# Patient Record
Sex: Female | Born: 1970 | Hispanic: No | Marital: Married
Health system: Southern US, Community
[De-identification: ages and names within clinical notes are randomized; demographics above are authoritative.]

---

## 2005-01-31 ENCOUNTER — Other Ambulatory Visit: Payer: Self-pay

## 2005-01-31 ENCOUNTER — Emergency Department: Payer: Self-pay | Admitting: Internal Medicine

## 2005-07-05 ENCOUNTER — Ambulatory Visit: Payer: Self-pay

## 2006-02-26 ENCOUNTER — Emergency Department: Payer: Self-pay | Admitting: Emergency Medicine

## 2006-10-13 ENCOUNTER — Emergency Department: Payer: Self-pay | Admitting: Emergency Medicine

## 2007-03-07 ENCOUNTER — Emergency Department: Payer: Self-pay | Admitting: Emergency Medicine

## 2007-04-10 ENCOUNTER — Inpatient Hospital Stay: Payer: Self-pay | Admitting: Otolaryngology

## 2007-04-28 ENCOUNTER — Inpatient Hospital Stay: Payer: Self-pay | Admitting: Internal Medicine

## 2008-01-01 ENCOUNTER — Emergency Department: Payer: Self-pay | Admitting: Emergency Medicine

## 2008-01-29 ENCOUNTER — Emergency Department: Payer: Self-pay | Admitting: Emergency Medicine

## 2008-10-15 ENCOUNTER — Ambulatory Visit: Payer: Self-pay | Admitting: Internal Medicine

## 2008-11-12 ENCOUNTER — Emergency Department: Payer: Self-pay

## 2010-03-24 ENCOUNTER — Ambulatory Visit: Payer: Self-pay | Admitting: Internal Medicine

## 2010-04-05 ENCOUNTER — Inpatient Hospital Stay: Payer: Self-pay | Admitting: Psychiatry

## 2010-04-10 ENCOUNTER — Ambulatory Visit: Payer: Self-pay | Admitting: Cardiology

## 2010-04-10 ENCOUNTER — Observation Stay: Payer: Self-pay | Admitting: Internal Medicine

## 2010-05-02 ENCOUNTER — Inpatient Hospital Stay: Payer: Self-pay | Admitting: Internal Medicine

## 2010-05-24 ENCOUNTER — Ambulatory Visit: Payer: Self-pay | Admitting: Internal Medicine

## 2010-05-27 ENCOUNTER — Inpatient Hospital Stay: Payer: Self-pay | Admitting: Internal Medicine

## 2010-06-23 ENCOUNTER — Ambulatory Visit: Payer: Self-pay | Admitting: Internal Medicine

## 2011-03-15 ENCOUNTER — Inpatient Hospital Stay: Payer: Self-pay | Admitting: Internal Medicine

## 2011-06-24 ENCOUNTER — Ambulatory Visit: Payer: Self-pay | Admitting: Internal Medicine

## 2011-07-11 ENCOUNTER — Inpatient Hospital Stay: Payer: Self-pay | Admitting: Internal Medicine

## 2011-07-25 ENCOUNTER — Ambulatory Visit: Payer: Self-pay | Admitting: Internal Medicine

## 2011-08-23 ENCOUNTER — Inpatient Hospital Stay: Payer: Self-pay | Admitting: *Deleted

## 2012-01-26 ENCOUNTER — Inpatient Hospital Stay: Payer: Self-pay | Admitting: Psychiatry

## 2012-01-26 LAB — COMPREHENSIVE METABOLIC PANEL
Albumin: 3.1 g/dL — ABNORMAL LOW (ref 3.4–5.0)
Alkaline Phosphatase: 66 U/L (ref 50–136)
BUN: 8 mg/dL (ref 7–18)
Bilirubin,Total: 2.1 mg/dL — ABNORMAL HIGH (ref 0.2–1.0)
Chloride: 101 mmol/L (ref 98–107)
Co2: 22 mmol/L (ref 21–32)
Creatinine: 0.86 mg/dL (ref 0.60–1.30)
EGFR (African American): >60
EGFR (Non-African Amer.): >60
Osmolality: 276 (ref 275–301)
Potassium: 3.2 mmol/L — ABNORMAL LOW (ref 3.5–5.1)
Sodium: 140 mmol/L (ref 136–145)
Total Protein: 8.3 g/dL — ABNORMAL HIGH (ref 6.4–8.2)

## 2012-01-26 LAB — CBC
HGB: 9.3 g/dL — ABNORMAL LOW (ref 12.0–16.0)
MCH: 31.8 pg (ref 26.0–34.0)
MCHC: 33.4 g/dL (ref 32.0–36.0)
Platelet: 92 10*3/uL — ABNORMAL LOW (ref 150–440)
RDW: 17 % — ABNORMAL HIGH (ref 11.5–14.5)

## 2012-01-26 LAB — DRUG SCREEN, URINE
Barbiturates, Ur Screen: NEGATIVE (ref ?–200)
Cannabinoid 50 Ng, Ur ~~LOC~~: NEGATIVE (ref ?–50)
Cocaine Metabolite,Ur ~~LOC~~: POSITIVE (ref ?–300)
MDMA (Ecstasy)Ur Screen: NEGATIVE (ref ?–500)
Phencyclidine (PCP) Ur S: NEGATIVE (ref ?–25)

## 2012-01-26 LAB — SALICYLATE LEVEL: Salicylates, Serum: <1.7 mg/dL

## 2012-01-26 LAB — TSH: Thyroid Stimulating Horm: 0.846 u[IU]/mL

## 2012-01-26 LAB — ACETAMINOPHEN LEVEL: Acetaminophen: <2 ug/mL

## 2012-01-29 LAB — BEHAVIORAL MEDICINE 1 PANEL
Alkaline Phosphatase: 72 U/L (ref 50–136)
BUN: 4 mg/dL — ABNORMAL LOW (ref 7–18)
Basophil #: 0 10*3/uL (ref 0.0–0.1)
Basophil %: 0.8 %
Bilirubin,Total: 1.1 mg/dL — ABNORMAL HIGH (ref 0.2–1.0)
Calcium, Total: 8.2 mg/dL — ABNORMAL LOW (ref 8.5–10.1)
Co2: 27 mmol/L (ref 21–32)
Creatinine: 0.9 mg/dL (ref 0.60–1.30)
EGFR (African American): >60
EGFR (Non-African Amer.): >60
Eosinophil #: 0.4 10*3/uL (ref 0.0–0.7)
Eosinophil %: 10.4 %
Glucose: 91 mg/dL (ref 65–99)
HCT: 26.9 % — ABNORMAL LOW (ref 35.0–47.0)
Lymphocyte #: 0.5 10*3/uL — ABNORMAL LOW (ref 1.0–3.6)
MCH: 32 pg (ref 26.0–34.0)
MCV: 97 fL (ref 80–100)
Monocyte #: 0.3 10*3/uL (ref 0.0–0.7)
Monocyte %: 9.3 %
Neutrophil #: 2.3 10*3/uL (ref 1.4–6.5)
Neutrophil %: 64.4 %
Osmolality: 276 (ref 275–301)
Potassium: 3.7 mmol/L (ref 3.5–5.1)
RBC: 2.76 10*6/uL — ABNORMAL LOW (ref 3.80–5.20)
SGOT(AST): 55 U/L — ABNORMAL HIGH (ref 15–37)
SGPT (ALT): 32 U/L
Sodium: 140 mmol/L (ref 136–145)
Thyroid Stimulating Horm: 1.43 u[IU]/mL
WBC: 3.6 10*3/uL (ref 3.6–11.0)

## 2012-01-29 LAB — PROTIME-INR: Prothrombin Time: 17.6 secs — ABNORMAL HIGH (ref 11.5–14.7)

## 2012-01-30 LAB — IRON: Iron: 208 ug/dL — ABNORMAL HIGH (ref 50–170)

## 2012-03-15 ENCOUNTER — Inpatient Hospital Stay: Payer: Self-pay | Admitting: Internal Medicine

## 2012-03-15 LAB — COMPREHENSIVE METABOLIC PANEL
Albumin: 2.5 g/dL — ABNORMAL LOW (ref 3.4–5.0)
Alkaline Phosphatase: 55 U/L (ref 50–136)
Calcium, Total: 7.8 mg/dL — ABNORMAL LOW (ref 8.5–10.1)
Glucose: 114 mg/dL — ABNORMAL HIGH (ref 65–99)
SGOT(AST): 96 U/L — ABNORMAL HIGH (ref 15–37)
SGPT (ALT): 37 U/L
Sodium: 132 mmol/L — ABNORMAL LOW (ref 136–145)

## 2012-03-15 LAB — URINALYSIS, COMPLETE
Blood: NEGATIVE
Glucose,UR: NEGATIVE mg/dL (ref 0–75)
Ketone: NEGATIVE
Ph: 5 (ref 4.5–8.0)
Protein: NEGATIVE
RBC,UR: NONE SEEN /HPF (ref 0–5)
Specific Gravity: 1.015 (ref 1.003–1.030)
Squamous Epithelial: <1
WBC UR: NONE SEEN /HPF (ref 0–5)

## 2012-03-15 LAB — ETHANOL
Ethanol %: 0.051 % (ref 0.000–0.080)
Ethanol: 51 mg/dL

## 2012-03-15 LAB — CBC
HCT: 16.5 % — ABNORMAL LOW (ref 35.0–47.0)
HGB: 5.4 g/dL — ABNORMAL LOW (ref 12.0–16.0)
MCV: 94 fL (ref 80–100)
RBC: 1.75 10*6/uL — ABNORMAL LOW (ref 3.80–5.20)
WBC: 7 10*3/uL (ref 3.6–11.0)

## 2012-03-15 LAB — TROPONIN I: Troponin-I: <0.02 ng/mL

## 2012-03-15 LAB — LIPASE, BLOOD: Lipase: 686 U/L — ABNORMAL HIGH (ref 73–393)

## 2012-03-16 LAB — CBC WITH DIFFERENTIAL/PLATELET
Basophil #: 0 10*3/uL (ref 0.0–0.1)
Basophil #: 0 10*3/uL (ref 0.0–0.1)
Basophil #: 0 10*3/uL (ref 0.0–0.1)
Basophil %: 0.3 %
Basophil %: 0.6 %
Basophil %: 0.4 %
Eosinophil #: 0.2 10*3/uL (ref 0.0–0.7)
Eosinophil #: 0.2 10*3/uL (ref 0.0–0.7)
Eosinophil #: 0.2 10*3/uL (ref 0.0–0.7)
Eosinophil %: 4.2 %
Eosinophil %: 4.5 %
HCT: 21.9 % — ABNORMAL LOW (ref 35.0–47.0)
HCT: 24.9 % — ABNORMAL LOW (ref 35.0–47.0)
HCT: 24.6 % — ABNORMAL LOW (ref 35.0–47.0)
HGB: 7.2 g/dL — ABNORMAL LOW (ref 12.0–16.0)
HGB: 8.2 g/dL — ABNORMAL LOW (ref 12.0–16.0)
Lymphocyte #: 0.3 10*3/uL — ABNORMAL LOW (ref 1.0–3.6)
Lymphocyte #: 0.3 10*3/uL — ABNORMAL LOW (ref 1.0–3.6)
Lymphocyte %: 10.9 %
Lymphocyte %: 7.6 %
MCH: 30.4 pg (ref 26.0–34.0)
MCH: 29.6 pg (ref 26.0–34.0)
MCHC: 32.8 g/dL (ref 32.0–36.0)
MCV: 92 fL (ref 80–100)
Monocyte #: 0.6 10*3/uL (ref 0.0–0.7)
Monocyte #: 0.5 10*3/uL (ref 0.0–0.7)
Monocyte %: 14.1 %
Monocyte %: 12.9 %
Monocyte %: 10.6 %
Neutrophil #: 3.2 10*3/uL (ref 1.4–6.5)
Neutrophil #: 2.9 10*3/uL (ref 1.4–6.5)
Neutrophil #: 2.9 10*3/uL (ref 1.4–6.5)
Neutrophil %: 70.5 %
Neutrophil %: 76.9 %
Platelet: 92 10*3/uL — ABNORMAL LOW (ref 150–440)
Platelet: 82 10*3/uL — ABNORMAL LOW (ref 150–440)
RBC: 2.41 10*6/uL — ABNORMAL LOW (ref 3.80–5.20)
RBC: 2.72 10*6/uL — ABNORMAL LOW (ref 3.80–5.20)
RDW: 17 % — ABNORMAL HIGH (ref 11.5–14.5)
RDW: 17.2 % — ABNORMAL HIGH (ref 11.5–14.5)
WBC: 4.5 10*3/uL (ref 3.6–11.0)
WBC: 3.7 10*3/uL (ref 3.6–11.0)

## 2012-03-16 LAB — COMPREHENSIVE METABOLIC PANEL
Albumin: 2.4 g/dL — ABNORMAL LOW (ref 3.4–5.0)
Alkaline Phosphatase: 51 U/L (ref 50–136)
BUN: 47 mg/dL — ABNORMAL HIGH (ref 7–18)
Bilirubin,Total: 2.8 mg/dL — ABNORMAL HIGH (ref 0.2–1.0)
Calcium, Total: 7.2 mg/dL — ABNORMAL LOW (ref 8.5–10.1)
Chloride: 92 mmol/L — ABNORMAL LOW (ref 98–107)
Creatinine: 1.41 mg/dL — ABNORMAL HIGH (ref 0.60–1.30)
EGFR (African American): 53 — ABNORMAL LOW
Glucose: 98 mg/dL (ref 65–99)
SGOT(AST): 88 U/L — ABNORMAL HIGH (ref 15–37)
SGPT (ALT): 37 U/L
Sodium: 133 mmol/L — ABNORMAL LOW (ref 136–145)
Total Protein: 6.1 g/dL — ABNORMAL LOW (ref 6.4–8.2)

## 2012-03-16 LAB — PHOSPHORUS: Phosphorus: 4.8 mg/dL

## 2012-03-16 LAB — MAGNESIUM: Magnesium: 1.8 mg/dL

## 2012-03-16 LAB — LIPASE, BLOOD: Lipase: 1034 U/L — ABNORMAL HIGH

## 2012-03-17 LAB — COMPREHENSIVE METABOLIC PANEL
Albumin: 2.3 g/dL — ABNORMAL LOW (ref 3.4–5.0)
Anion Gap: 9 (ref 7–16)
BUN: 22 mg/dL — ABNORMAL HIGH (ref 7–18)
Bilirubin,Total: 2.9 mg/dL — ABNORMAL HIGH (ref 0.2–1.0)
Chloride: 95 mmol/L — ABNORMAL LOW (ref 98–107)
Creatinine: 0.93 mg/dL (ref 0.60–1.30)
Osmolality: 266 (ref 275–301)
Potassium: 3.6 mmol/L (ref 3.5–5.1)
Sodium: 130 mmol/L — ABNORMAL LOW (ref 136–145)
Total Protein: 6 g/dL — ABNORMAL LOW (ref 6.4–8.2)

## 2012-03-18 LAB — COMPREHENSIVE METABOLIC PANEL
Anion Gap: 11 (ref 7–16)
Bilirubin,Total: 2.5 mg/dL — ABNORMAL HIGH (ref 0.2–1.0)
Chloride: 100 mmol/L (ref 98–107)
Creatinine: 0.84 mg/dL (ref 0.60–1.30)
EGFR (African American): >60
EGFR (Non-African Amer.): >60
Potassium: 3.6 mmol/L (ref 3.5–5.1)
Sodium: 135 mmol/L — ABNORMAL LOW (ref 136–145)
Total Protein: 5.6 g/dL — ABNORMAL LOW (ref 6.4–8.2)

## 2012-03-18 LAB — CBC WITH DIFFERENTIAL/PLATELET
Basophil #: 0 10*3/uL (ref 0.0–0.1)
Basophil %: 0.8 %
Eosinophil %: 2.4 %
HCT: 24 % — ABNORMAL LOW (ref 35.0–47.0)
Lymphocyte #: 0.5 10*3/uL — ABNORMAL LOW (ref 1.0–3.6)
Lymphocyte %: 12.6 %
MCH: 30.3 pg (ref 26.0–34.0)
MCHC: 32.6 g/dL (ref 32.0–36.0)
Platelet: 86 10*3/uL — ABNORMAL LOW (ref 150–440)

## 2012-03-18 LAB — AMMONIA: Ammonia, Plasma: 83 mcmol/L — ABNORMAL HIGH (ref 11–32)

## 2012-03-18 LAB — PROTIME-INR: Prothrombin Time: 17.2 secs — ABNORMAL HIGH (ref 11.5–14.7)

## 2012-03-19 LAB — CBC WITH DIFFERENTIAL/PLATELET
Basophil #: 0 10*3/uL (ref 0.0–0.1)
Basophil %: 0.7 %
Eosinophil #: 0.1 10*3/uL (ref 0.0–0.7)
HCT: 23.4 % — ABNORMAL LOW (ref 35.0–47.0)
HGB: 7.7 g/dL — ABNORMAL LOW (ref 12.0–16.0)
Lymphocyte %: 14.7 %
MCH: 30.3 pg (ref 26.0–34.0)
MCHC: 33 g/dL (ref 32.0–36.0)
Monocyte %: 18.2 %
Neutrophil %: 62.8 %
RBC: 2.55 10*6/uL — ABNORMAL LOW (ref 3.80–5.20)
RDW: 17 % — ABNORMAL HIGH (ref 11.5–14.5)
WBC: 3.1 10*3/uL — ABNORMAL LOW (ref 3.6–11.0)

## 2012-03-19 LAB — COMPREHENSIVE METABOLIC PANEL
Albumin: 2.1 g/dL — ABNORMAL LOW (ref 3.4–5.0)
Alkaline Phosphatase: 63 U/L (ref 50–136)
Anion Gap: 11 (ref 7–16)
BUN: 9 mg/dL (ref 7–18)
Calcium, Total: 6.9 mg/dL — CL (ref 8.5–10.1)
Chloride: 99 mmol/L (ref 98–107)
Co2: 23 mmol/L (ref 21–32)
Creatinine: 1.01 mg/dL (ref 0.60–1.30)
EGFR (African American): >60
EGFR (Non-African Amer.): >60
Osmolality: 265 (ref 275–301)
Total Protein: 5.6 g/dL — ABNORMAL LOW (ref 6.4–8.2)

## 2012-03-19 LAB — AMMONIA: Ammonia, Plasma: 48 mcmol/L — ABNORMAL HIGH (ref 11–32)

## 2012-03-20 LAB — PROTIME-INR: Prothrombin Time: 17.2 secs — ABNORMAL HIGH (ref 11.5–14.7)

## 2012-03-20 LAB — COMPREHENSIVE METABOLIC PANEL
Albumin: 2 g/dL — ABNORMAL LOW (ref 3.4–5.0)
Alkaline Phosphatase: 67 U/L (ref 50–136)
Anion Gap: 12 (ref 7–16)
BUN: 7 mg/dL (ref 7–18)
Bilirubin,Total: 1.9 mg/dL — ABNORMAL HIGH (ref 0.2–1.0)
Co2: 22 mmol/L (ref 21–32)
Creatinine: 0.93 mg/dL (ref 0.60–1.30)
EGFR (African American): >60
EGFR (Non-African Amer.): >60
Glucose: 82 mg/dL (ref 65–99)
Osmolality: 269 (ref 275–301)
Potassium: 3.7 mmol/L (ref 3.5–5.1)
SGPT (ALT): 55 U/L
Sodium: 136 mmol/L (ref 136–145)
Total Protein: 5.4 g/dL — ABNORMAL LOW (ref 6.4–8.2)

## 2012-03-20 LAB — CBC WITH DIFFERENTIAL/PLATELET
Basophil #: 0 10*3/uL (ref 0.0–0.1)
Basophil %: 0.8 %
Eosinophil #: 0.1 10*3/uL (ref 0.0–0.7)
Eosinophil %: 2.7 %
HCT: 23.9 % — ABNORMAL LOW (ref 35.0–47.0)
HGB: 7.9 g/dL — ABNORMAL LOW (ref 12.0–16.0)
Lymphocyte %: 15 %
MCV: 93 fL (ref 80–100)
Monocyte #: 0.7 10*3/uL (ref 0.0–0.7)
Monocyte %: 18.9 %
Neutrophil %: 62.6 %
RDW: 17.6 % — ABNORMAL HIGH (ref 11.5–14.5)
WBC: 3.6 10*3/uL (ref 3.6–11.0)

## 2012-03-21 LAB — CBC WITH DIFFERENTIAL/PLATELET
Basophil #: 0 10*3/uL (ref 0.0–0.1)
Basophil %: 0.8 %
Eosinophil %: 1.4 %
HGB: 7.4 g/dL — ABNORMAL LOW (ref 12.0–16.0)
Lymphocyte #: 0.5 10*3/uL — ABNORMAL LOW (ref 1.0–3.6)
Lymphocyte %: 17.9 %
MCH: 29.3 pg (ref 26.0–34.0)
MCHC: 31.9 g/dL — ABNORMAL LOW (ref 32.0–36.0)
MCV: 92 fL (ref 80–100)
Monocyte #: 0.6 10*3/uL (ref 0.0–0.7)
Monocyte %: 19.8 %
Platelet: 98 10*3/uL — ABNORMAL LOW (ref 150–440)
RBC: 2.53 10*6/uL — ABNORMAL LOW (ref 3.80–5.20)
RDW: 17.1 % — ABNORMAL HIGH (ref 11.5–14.5)

## 2012-03-21 LAB — HEPATIC FUNCTION PANEL A (ARMC)
Albumin: 2 g/dL — ABNORMAL LOW (ref 3.4–5.0)
Alkaline Phosphatase: 63 U/L (ref 50–136)
Bilirubin, Direct: 0.7 mg/dL — ABNORMAL HIGH (ref 0.00–0.20)

## 2012-06-25 LAB — CBC
HGB: 8.9 g/dL — ABNORMAL LOW (ref 12.0–16.0)
MCH: 32.3 pg (ref 26.0–34.0)
MCHC: 33.2 g/dL (ref 32.0–36.0)
MCV: 97 fL (ref 80–100)
Platelet: 78 10*3/uL — ABNORMAL LOW (ref 150–440)
RDW: 16.4 % — ABNORMAL HIGH (ref 11.5–14.5)

## 2012-06-25 LAB — DRUG SCREEN, URINE
Barbiturates, Ur Screen: NEGATIVE (ref ?–200)
Cannabinoid 50 Ng, Ur ~~LOC~~: NEGATIVE (ref ?–50)
Cocaine Metabolite,Ur ~~LOC~~: POSITIVE (ref ?–300)
MDMA (Ecstasy)Ur Screen: NEGATIVE (ref ?–500)
Opiate, Ur Screen: NEGATIVE (ref ?–300)
Phencyclidine (PCP) Ur S: NEGATIVE (ref ?–25)
Tricyclic, Ur Screen: NEGATIVE (ref ?–1000)

## 2012-06-25 LAB — COMPREHENSIVE METABOLIC PANEL
Anion Gap: 10 (ref 7–16)
BUN: 4 mg/dL — ABNORMAL LOW (ref 7–18)
Calcium, Total: 7.8 mg/dL — ABNORMAL LOW (ref 8.5–10.1)
Chloride: 109 mmol/L — ABNORMAL HIGH (ref 98–107)
EGFR (African American): >60
Glucose: 89 mg/dL (ref 65–99)
Potassium: 3.5 mmol/L (ref 3.5–5.1)
SGOT(AST): 79 U/L — ABNORMAL HIGH (ref 15–37)
Sodium: 142 mmol/L (ref 136–145)
Total Protein: 7.5 g/dL (ref 6.4–8.2)

## 2012-06-25 LAB — URINALYSIS, COMPLETE
Bilirubin,UR: NEGATIVE
Blood: NEGATIVE
Glucose,UR: NEGATIVE mg/dL (ref 0–75)
Leukocyte Esterase: NEGATIVE
RBC,UR: 2 /HPF (ref 0–5)
Squamous Epithelial: 14
WBC UR: 1 /HPF (ref 0–5)

## 2012-06-25 LAB — ETHANOL: Ethanol: 239 mg/dL

## 2012-06-26 ENCOUNTER — Inpatient Hospital Stay: Payer: Self-pay | Admitting: Psychiatry

## 2012-06-26 LAB — MAGNESIUM: Magnesium: 1.7 mg/dL — ABNORMAL LOW

## 2012-06-27 LAB — HEPATIC FUNCTION PANEL A (ARMC)
Albumin: 2.4 g/dL — ABNORMAL LOW (ref 3.4–5.0)
Bilirubin,Total: 2.5 mg/dL — ABNORMAL HIGH (ref 0.2–1.0)
SGOT(AST): 60 U/L — ABNORMAL HIGH (ref 15–37)
SGPT (ALT): 26 U/L
Total Protein: 6.5 g/dL (ref 6.4–8.2)

## 2012-06-27 LAB — CBC WITH DIFFERENTIAL/PLATELET
Basophil #: 0 10*3/uL (ref 0.0–0.1)
HGB: 8.1 g/dL — ABNORMAL LOW (ref 12.0–16.0)
Lymphocyte #: 0.3 10*3/uL — ABNORMAL LOW (ref 1.0–3.6)
Lymphocyte %: 15.7 %
MCH: 32.3 pg (ref 26.0–34.0)
MCV: 97 fL (ref 80–100)
Monocyte #: 0.2 x10 3/mm (ref 0.2–0.9)
Platelet: 58 10*3/uL — ABNORMAL LOW (ref 150–440)
RBC: 2.49 10*6/uL — ABNORMAL LOW (ref 3.80–5.20)
RDW: 15.6 % — ABNORMAL HIGH (ref 11.5–14.5)
WBC: 1.8 10*3/uL — CL (ref 3.6–11.0)

## 2012-06-27 LAB — PROTIME-INR
INR: 1.2
Prothrombin Time: 15.7 secs — ABNORMAL HIGH (ref 11.5–14.7)

## 2012-06-28 LAB — CBC WITH DIFFERENTIAL/PLATELET
Basophil %: 0.5 %
Eosinophil #: 0.2 10*3/uL (ref 0.0–0.7)
HCT: 26.4 % — ABNORMAL LOW (ref 35.0–47.0)
HGB: 8.8 g/dL — ABNORMAL LOW (ref 12.0–16.0)
MCV: 97 fL (ref 80–100)
Monocyte #: 0.3 x10 3/mm (ref 0.2–0.9)
Neutrophil %: 64.2 %
Platelet: 68 10*3/uL — ABNORMAL LOW (ref 150–440)
RBC: 2.72 10*6/uL — ABNORMAL LOW (ref 3.80–5.20)
RDW: 16.3 % — ABNORMAL HIGH (ref 11.5–14.5)
WBC: 2.4 10*3/uL — ABNORMAL LOW (ref 3.6–11.0)

## 2012-06-28 LAB — IRON AND TIBC
Iron Bind.Cap.(Total): 392 ug/dL (ref 250–450)
Iron Saturation: 18 %
Iron: 70 ug/dL (ref 50–170)
Unbound Iron-Bind.Cap.: 322 ug/dL

## 2012-06-28 LAB — MAGNESIUM: Magnesium: 1.7 mg/dL — ABNORMAL LOW

## 2012-06-30 LAB — AMMONIA: Ammonia, Plasma: 57 mcmol/L — ABNORMAL HIGH (ref 11–32)

## 2012-06-30 LAB — CBC WITH DIFFERENTIAL/PLATELET
Eosinophil #: 0.3 10*3/uL (ref 0.0–0.7)
Eosinophil %: 10.3 %
Lymphocyte %: 19.3 %
MCH: 32.4 pg (ref 26.0–34.0)
Monocyte #: 0.3 x10 3/mm (ref 0.2–0.9)
Neutrophil %: 56.7 %
Platelet: 67 10*3/uL — ABNORMAL LOW (ref 150–440)
RBC: 2.58 10*6/uL — ABNORMAL LOW (ref 3.80–5.20)
WBC: 2.6 10*3/uL — ABNORMAL LOW (ref 3.6–11.0)

## 2012-06-30 LAB — COMPREHENSIVE METABOLIC PANEL
Albumin: 2.3 g/dL — ABNORMAL LOW (ref 3.4–5.0)
Alkaline Phosphatase: 95 U/L (ref 50–136)
Anion Gap: 5 — ABNORMAL LOW (ref 7–16)
Calcium, Total: 7.2 mg/dL — ABNORMAL LOW (ref 8.5–10.1)
Co2: 25 mmol/L (ref 21–32)
Potassium: 3.5 mmol/L (ref 3.5–5.1)
SGOT(AST): 49 U/L — ABNORMAL HIGH (ref 15–37)
SGPT (ALT): 25 U/L

## 2012-06-30 LAB — MAGNESIUM: Magnesium: 1.8 mg/dL

## 2012-07-01 LAB — CBC WITH DIFFERENTIAL/PLATELET
Basophil #: 0 10*3/uL (ref 0.0–0.1)
Eosinophil #: 0.2 10*3/uL (ref 0.0–0.7)
Lymphocyte #: 0.4 10*3/uL — ABNORMAL LOW (ref 1.0–3.6)
Lymphocyte %: 15.5 %
MCHC: 32.9 g/dL (ref 32.0–36.0)
Monocyte #: 0.4 x10 3/mm (ref 0.2–0.9)
Monocyte %: 17.4 %
Neutrophil #: 1.4 10*3/uL (ref 1.4–6.5)
Neutrophil %: 57.4 %
Platelet: 73 10*3/uL — ABNORMAL LOW (ref 150–440)
RDW: 16.1 % — ABNORMAL HIGH (ref 11.5–14.5)
WBC: 2.5 10*3/uL — ABNORMAL LOW (ref 3.6–11.0)

## 2012-07-02 LAB — HEMOGLOBIN: HGB: 8.5 g/dL — ABNORMAL LOW (ref 12.0–16.0)

## 2012-07-02 LAB — AMMONIA: Ammonia, Plasma: 33 mcmol/L — ABNORMAL HIGH (ref 11–32)

## 2012-07-02 LAB — HEMATOCRIT: HCT: 26.1 % — ABNORMAL LOW (ref 35.0–47.0)

## 2012-07-03 LAB — HEMOGLOBIN: HGB: 9 g/dL — ABNORMAL LOW (ref 12.0–16.0)

## 2012-08-28 ENCOUNTER — Inpatient Hospital Stay: Payer: Self-pay | Admitting: Internal Medicine

## 2012-08-28 LAB — COMPREHENSIVE METABOLIC PANEL
Alkaline Phosphatase: 82 U/L (ref 50–136)
Anion Gap: 12 (ref 7–16)
Calcium, Total: 7.9 mg/dL — ABNORMAL LOW (ref 8.5–10.1)
Chloride: 93 mmol/L — ABNORMAL LOW (ref 98–107)
Co2: 30 mmol/L (ref 21–32)
EGFR (African American): 60 — ABNORMAL LOW
EGFR (Non-African Amer.): 52 — ABNORMAL LOW
SGPT (ALT): 23 U/L (ref 12–78)

## 2012-08-28 LAB — URINALYSIS, COMPLETE
Bilirubin,UR: NEGATIVE
Glucose,UR: NEGATIVE mg/dL (ref 0–75)
Leukocyte Esterase: NEGATIVE
Nitrite: NEGATIVE
Ph: 6 (ref 4.5–8.0)
Protein: NEGATIVE
RBC,UR: 1 /HPF (ref 0–5)

## 2012-08-28 LAB — PROTIME-INR: INR: 1.7

## 2012-08-28 LAB — CBC WITH DIFFERENTIAL/PLATELET
Basophil %: 0.2 %
Eosinophil #: 0 10*3/uL (ref 0.0–0.7)
HCT: 28.3 % — ABNORMAL LOW (ref 35.0–47.0)
Lymphocyte %: 6.7 %
MCH: 30.9 pg (ref 26.0–34.0)
MCV: 93 fL (ref 80–100)
Monocyte %: 7.9 %
Platelet: 151 10*3/uL (ref 150–440)
RBC: 3.06 10*6/uL — ABNORMAL LOW (ref 3.80–5.20)
RDW: 15.9 % — ABNORMAL HIGH (ref 11.5–14.5)
WBC: 7.7 10*3/uL (ref 3.6–11.0)

## 2012-08-28 LAB — DRUG SCREEN, URINE
Amphetamines, Ur Screen: NEGATIVE (ref ?–1000)
Barbiturates, Ur Screen: NEGATIVE (ref ?–200)
Benzodiazepine, Ur Scrn: NEGATIVE (ref ?–200)
Cannabinoid 50 Ng, Ur ~~LOC~~: NEGATIVE (ref ?–50)
Cocaine Metabolite,Ur ~~LOC~~: POSITIVE (ref ?–300)
MDMA (Ecstasy)Ur Screen: NEGATIVE (ref ?–500)
Methadone, Ur Screen: NEGATIVE (ref ?–300)
Opiate, Ur Screen: POSITIVE (ref ?–300)
Phencyclidine (PCP) Ur S: NEGATIVE (ref ?–25)

## 2012-08-28 LAB — AMMONIA: Ammonia, Plasma: 35 mcmol/L — ABNORMAL HIGH (ref 11–32)

## 2012-08-28 LAB — LIPASE, BLOOD: Lipase: 107 U/L (ref 73–393)

## 2012-08-28 LAB — CK TOTAL AND CKMB (NOT AT ARMC)
CK, Total: 101 U/L (ref 21–215)
CK-MB: 0.8 ng/mL (ref 0.5–3.6)

## 2012-08-28 LAB — APTT: Activated PTT: 33.7 secs (ref 23.6–35.9)

## 2012-08-28 LAB — ETHANOL: Ethanol %: <0.003 % (ref 0.000–0.080)

## 2012-08-29 LAB — CBC WITH DIFFERENTIAL/PLATELET
Basophil #: 0.1 10*3/uL (ref 0.0–0.1)
Basophil %: 0.7 %
Eosinophil #: 0.1 10*3/uL (ref 0.0–0.7)
Eosinophil %: 1.1 %
HCT: 22.2 % — ABNORMAL LOW (ref 35.0–47.0)
HGB: 7.6 g/dL — ABNORMAL LOW (ref 12.0–16.0)
Lymphocyte #: 0.9 10*3/uL — ABNORMAL LOW (ref 1.0–3.6)
Lymphocyte %: 10.4 %
MCHC: 34.3 g/dL (ref 32.0–36.0)
MCV: 91 fL (ref 80–100)
Monocyte %: 11.3 %
Neutrophil #: 7 10*3/uL — ABNORMAL HIGH (ref 1.4–6.5)
Neutrophil %: 76.5 %
RBC: 2.45 10*6/uL — ABNORMAL LOW (ref 3.80–5.20)
RDW: 15.4 % — ABNORMAL HIGH (ref 11.5–14.5)
WBC: 9.1 10*3/uL (ref 3.6–11.0)

## 2012-08-29 LAB — BASIC METABOLIC PANEL
Anion Gap: 8 (ref 7–16)
Chloride: 102 mmol/L (ref 98–107)
EGFR (Non-African Amer.): 58 — ABNORMAL LOW
Glucose: 121 mg/dL — ABNORMAL HIGH (ref 65–99)
Potassium: 3.4 mmol/L — ABNORMAL LOW (ref 3.5–5.1)
Sodium: 138 mmol/L (ref 136–145)

## 2012-08-29 LAB — MAGNESIUM: Magnesium: 1.6 mg/dL — ABNORMAL LOW

## 2012-08-29 LAB — HEMOGLOBIN: HGB: 8.9 g/dL — ABNORMAL LOW (ref 12.0–16.0)

## 2012-08-30 LAB — CBC WITH DIFFERENTIAL/PLATELET
Basophil #: 0 10*3/uL (ref 0.0–0.1)
Eosinophil #: 0.1 10*3/uL (ref 0.0–0.7)
HCT: 23.6 % — ABNORMAL LOW (ref 35.0–47.0)
Lymphocyte #: 0.6 10*3/uL — ABNORMAL LOW (ref 1.0–3.6)
MCH: 33.8 pg (ref 26.0–34.0)
MCHC: 36.3 g/dL — ABNORMAL HIGH (ref 32.0–36.0)
MCV: 93 fL (ref 80–100)
Monocyte #: 0.9 x10 3/mm (ref 0.2–0.9)
Neutrophil #: 8.4 10*3/uL — ABNORMAL HIGH (ref 1.4–6.5)
Platelet: 100 10*3/uL — ABNORMAL LOW (ref 150–440)
RDW: 15.6 % — ABNORMAL HIGH (ref 11.5–14.5)
WBC: 10.1 10*3/uL (ref 3.6–11.0)

## 2012-08-30 LAB — URINE CULTURE

## 2012-08-30 LAB — BASIC METABOLIC PANEL
BUN: 28 mg/dL — ABNORMAL HIGH (ref 7–18)
Calcium, Total: 7.1 mg/dL — ABNORMAL LOW (ref 8.5–10.1)
Chloride: 108 mmol/L — ABNORMAL HIGH (ref 98–107)
Co2: 28 mmol/L (ref 21–32)
Creatinine: 0.9 mg/dL (ref 0.60–1.30)
EGFR (Non-African Amer.): >60
Potassium: 3.6 mmol/L (ref 3.5–5.1)
Sodium: 142 mmol/L (ref 136–145)

## 2012-08-31 LAB — COMPREHENSIVE METABOLIC PANEL
Anion Gap: 8 (ref 7–16)
BUN: 19 mg/dL — ABNORMAL HIGH (ref 7–18)
Calcium, Total: 7.2 mg/dL — ABNORMAL LOW (ref 8.5–10.1)
Chloride: 112 mmol/L — ABNORMAL HIGH (ref 98–107)
Co2: 26 mmol/L (ref 21–32)
EGFR (African American): >60
EGFR (Non-African Amer.): >60
Glucose: 120 mg/dL — ABNORMAL HIGH (ref 65–99)
Potassium: 3.1 mmol/L — ABNORMAL LOW (ref 3.5–5.1)
SGOT(AST): 66 U/L — ABNORMAL HIGH (ref 15–37)
SGPT (ALT): 35 U/L (ref 12–78)
Sodium: 146 mmol/L — ABNORMAL HIGH (ref 136–145)

## 2012-09-02 LAB — CBC WITH DIFFERENTIAL/PLATELET
Basophil #: 0 10*3/uL (ref 0.0–0.1)
Basophil %: 0.6 %
Eosinophil #: 0.3 10*3/uL (ref 0.0–0.7)
HCT: 20.4 % — ABNORMAL LOW (ref 35.0–47.0)
Lymphocyte %: 14.2 %
MCH: 31.6 pg (ref 26.0–34.0)
Monocyte #: 0.9 x10 3/mm (ref 0.2–0.9)
Monocyte %: 17.4 %
Neutrophil %: 61.1 %
Platelet: 78 10*3/uL — ABNORMAL LOW (ref 150–440)
RDW: 15.8 % — ABNORMAL HIGH (ref 11.5–14.5)
WBC: 5 10*3/uL (ref 3.6–11.0)

## 2012-09-02 LAB — LIPASE, BLOOD: Lipase: 377 U/L (ref 73–393)

## 2012-09-04 LAB — COMPREHENSIVE METABOLIC PANEL
Anion Gap: 8 (ref 7–16)
Calcium, Total: 7.1 mg/dL — ABNORMAL LOW (ref 8.5–10.1)
Chloride: 102 mmol/L (ref 98–107)
EGFR (African American): >60
Osmolality: 269 (ref 275–301)
Potassium: 3.1 mmol/L — ABNORMAL LOW (ref 3.5–5.1)
SGOT(AST): 44 U/L — ABNORMAL HIGH (ref 15–37)
Total Protein: 5.6 g/dL — ABNORMAL LOW (ref 6.4–8.2)

## 2012-09-04 LAB — CBC WITH DIFFERENTIAL/PLATELET
Basophil #: 0 10*3/uL (ref 0.0–0.1)
Eosinophil #: 0.2 10*3/uL (ref 0.0–0.7)
HGB: 8.8 g/dL — ABNORMAL LOW (ref 12.0–16.0)
MCH: 31.3 pg (ref 26.0–34.0)
MCHC: 34 g/dL (ref 32.0–36.0)
Monocyte #: 0.7 x10 3/mm (ref 0.2–0.9)
Neutrophil %: 69.5 %
Platelet: 72 10*3/uL — ABNORMAL LOW (ref 150–440)
RBC: 2.81 10*6/uL — ABNORMAL LOW (ref 3.80–5.20)
WBC: 4.7 10*3/uL (ref 3.6–11.0)

## 2012-09-04 LAB — MAGNESIUM: Magnesium: 1.6 mg/dL — ABNORMAL LOW

## 2012-09-05 LAB — CBC WITH DIFFERENTIAL/PLATELET
Basophil #: 0 10*3/uL (ref 0.0–0.1)
Basophil %: 0.8 %
HCT: 23.8 % — ABNORMAL LOW (ref 35.0–47.0)
HGB: 7.9 g/dL — ABNORMAL LOW (ref 12.0–16.0)
Lymphocyte #: 0.7 10*3/uL — ABNORMAL LOW (ref 1.0–3.6)
MCH: 30.4 pg (ref 26.0–34.0)
MCHC: 33.2 g/dL (ref 32.0–36.0)
Monocyte #: 1 x10 3/mm — ABNORMAL HIGH (ref 0.2–0.9)
Monocyte %: 20.9 %
Neutrophil #: 3 10*3/uL (ref 1.4–6.5)
Neutrophil %: 60.7 %

## 2012-09-05 LAB — BASIC METABOLIC PANEL
Calcium, Total: 7.3 mg/dL — ABNORMAL LOW (ref 8.5–10.1)
Chloride: 106 mmol/L (ref 98–107)
Creatinine: 0.79 mg/dL (ref 0.60–1.30)
EGFR (Non-African Amer.): >60
Glucose: 119 mg/dL — ABNORMAL HIGH (ref 65–99)
Potassium: 3.7 mmol/L (ref 3.5–5.1)
Sodium: 137 mmol/L (ref 136–145)

## 2012-09-05 LAB — MAGNESIUM: Magnesium: 1.4 mg/dL — ABNORMAL LOW

## 2012-09-22 LAB — COMPREHENSIVE METABOLIC PANEL
Albumin: 2.3 g/dL — ABNORMAL LOW (ref 3.4–5.0)
Anion Gap: 11 (ref 7–16)
BUN: 4 mg/dL — ABNORMAL LOW (ref 7–18)
Calcium, Total: 7.5 mg/dL — ABNORMAL LOW (ref 8.5–10.1)
Chloride: 105 mmol/L (ref 98–107)
Co2: 25 mmol/L (ref 21–32)
Creatinine: 1.02 mg/dL (ref 0.60–1.30)
Osmolality: 278 (ref 275–301)
SGOT(AST): 31 U/L (ref 15–37)
SGPT (ALT): 17 U/L (ref 12–78)
Total Protein: 6.3 g/dL — ABNORMAL LOW (ref 6.4–8.2)

## 2012-09-22 LAB — PROTIME-INR
INR: 1.5
Prothrombin Time: 18.4 secs — ABNORMAL HIGH (ref 11.5–14.7)

## 2012-09-22 LAB — URINALYSIS, COMPLETE
Bilirubin,UR: NEGATIVE
Glucose,UR: NEGATIVE mg/dL (ref 0–75)
Leukocyte Esterase: NEGATIVE
Nitrite: NEGATIVE
Ph: 6 (ref 4.5–8.0)
Protein: NEGATIVE
RBC,UR: <1 /HPF (ref 0–5)
Squamous Epithelial: 7
WBC UR: <1 /HPF (ref 0–5)

## 2012-09-22 LAB — CBC
HCT: 25.6 % — ABNORMAL LOW (ref 35.0–47.0)
HGB: 8.8 g/dL — ABNORMAL LOW (ref 12.0–16.0)
MCH: 30.5 pg (ref 26.0–34.0)
MCHC: 34.3 g/dL (ref 32.0–36.0)
MCV: 89 fL (ref 80–100)
RDW: 17.5 % — ABNORMAL HIGH (ref 11.5–14.5)

## 2012-09-22 LAB — AMYLASE: Amylase: 59 U/L (ref 25–115)

## 2012-09-23 ENCOUNTER — Inpatient Hospital Stay: Payer: Self-pay | Admitting: Internal Medicine

## 2012-09-24 LAB — CBC WITH DIFFERENTIAL/PLATELET
Basophil %: 1 %
Eosinophil #: 0.3 10*3/uL (ref 0.0–0.7)
HGB: 8.4 g/dL — ABNORMAL LOW (ref 12.0–16.0)
Lymphocyte #: 0.4 10*3/uL — ABNORMAL LOW (ref 1.0–3.6)
MCH: 28.9 pg (ref 26.0–34.0)
MCHC: 32.6 g/dL (ref 32.0–36.0)
Monocyte #: 0.4 x10 3/mm (ref 0.2–0.9)
Monocyte %: 14.7 %
Neutrophil #: 1.5 10*3/uL (ref 1.4–6.5)
Neutrophil %: 58.6 %
RBC: 2.91 10*6/uL — ABNORMAL LOW (ref 3.80–5.20)
WBC: 2.5 10*3/uL — ABNORMAL LOW (ref 3.6–11.0)

## 2012-09-24 LAB — BASIC METABOLIC PANEL
Anion Gap: 6 — ABNORMAL LOW (ref 7–16)
BUN: 3 mg/dL — ABNORMAL LOW (ref 7–18)
Calcium, Total: 7.2 mg/dL — ABNORMAL LOW (ref 8.5–10.1)
Co2: 26 mmol/L (ref 21–32)
Creatinine: 0.89 mg/dL (ref 0.60–1.30)
EGFR (African American): >60
Glucose: 100 mg/dL — ABNORMAL HIGH (ref 65–99)
Osmolality: 272 (ref 275–301)
Potassium: 3.5 mmol/L (ref 3.5–5.1)

## 2012-09-24 LAB — PROTIME-INR
INR: 1.5
Prothrombin Time: 18.4 secs — ABNORMAL HIGH (ref 11.5–14.7)

## 2012-09-25 LAB — AMMONIA: Ammonia, Plasma: 67 mcmol/L — ABNORMAL HIGH (ref 11–32)

## 2012-09-26 LAB — CBC WITH DIFFERENTIAL/PLATELET
Basophil #: 0 10*3/uL (ref 0.0–0.1)
Eosinophil #: 0 10*3/uL (ref 0.0–0.7)
HCT: 24.6 % — ABNORMAL LOW (ref 35.0–47.0)
Lymphocyte %: 8 %
MCV: 87 fL (ref 80–100)
Monocyte %: 10.6 %
Neutrophil #: 2.4 10*3/uL (ref 1.4–6.5)
Neutrophil %: 79.5 %
Platelet: 78 10*3/uL — ABNORMAL LOW (ref 150–440)
RBC: 2.83 10*6/uL — ABNORMAL LOW (ref 3.80–5.20)
RDW: 17.5 % — ABNORMAL HIGH (ref 11.5–14.5)
WBC: 3.1 10*3/uL — ABNORMAL LOW (ref 3.6–11.0)

## 2012-09-26 LAB — BASIC METABOLIC PANEL
Calcium, Total: 7.6 mg/dL — ABNORMAL LOW (ref 8.5–10.1)
EGFR (African American): >60
EGFR (Non-African Amer.): >60
Glucose: 96 mg/dL (ref 65–99)
Osmolality: 263 (ref 275–301)
Potassium: 3.3 mmol/L — ABNORMAL LOW (ref 3.5–5.1)
Sodium: 133 mmol/L — ABNORMAL LOW (ref 136–145)

## 2012-09-26 LAB — BODY FLUID CELL COUNT WITH DIFFERENTIAL
Neutrophils: 9 %
Nucleated Cell Count: 104 /mm3

## 2012-09-26 LAB — PROTIME-INR
INR: 1.4
Prothrombin Time: 17.9 secs — ABNORMAL HIGH (ref 11.5–14.7)

## 2012-09-27 LAB — URINALYSIS, COMPLETE
Bilirubin,UR: NEGATIVE
Blood: NEGATIVE
Glucose,UR: NEGATIVE mg/dL (ref 0–75)
Ph: 6 (ref 4.5–8.0)
Specific Gravity: 1.01 (ref 1.003–1.030)
Squamous Epithelial: 5

## 2012-09-27 LAB — BASIC METABOLIC PANEL
BUN: 4 mg/dL — ABNORMAL LOW (ref 7–18)
Chloride: 102 mmol/L (ref 98–107)
Co2: 27 mmol/L (ref 21–32)
Creatinine: 1.01 mg/dL (ref 0.60–1.30)
EGFR (African American): >60
Osmolality: 271 (ref 275–301)
Potassium: 3.5 mmol/L (ref 3.5–5.1)

## 2012-09-28 LAB — CULTURE, BLOOD (SINGLE)

## 2012-09-28 LAB — URINE CULTURE

## 2012-09-29 LAB — BASIC METABOLIC PANEL
Anion Gap: 7 (ref 7–16)
Calcium, Total: 7.4 mg/dL — ABNORMAL LOW (ref 8.5–10.1)
Co2: 32 mmol/L (ref 21–32)
EGFR (African American): >60
EGFR (Non-African Amer.): >60
Glucose: 92 mg/dL (ref 65–99)
Osmolality: 273 (ref 275–301)
Potassium: 3.2 mmol/L — ABNORMAL LOW (ref 3.5–5.1)

## 2012-09-29 LAB — CBC WITH DIFFERENTIAL/PLATELET
Basophil %: 0.7 %
Eosinophil %: 9.4 %
Lymphocyte #: 0.5 10*3/uL — ABNORMAL LOW (ref 1.0–3.6)
MCHC: 33.7 g/dL (ref 32.0–36.0)
Monocyte #: 0.5 x10 3/mm (ref 0.2–0.9)
Monocyte %: 17.4 %
Neutrophil %: 54.9 %
Platelet: 69 10*3/uL — ABNORMAL LOW (ref 150–440)
RDW: 17.9 % — ABNORMAL HIGH (ref 11.5–14.5)
WBC: 2.6 10*3/uL — ABNORMAL LOW (ref 3.6–11.0)

## 2012-09-30 LAB — CBC WITH DIFFERENTIAL/PLATELET
Basophil %: 0.6 %
Eosinophil %: 7.8 %
HCT: 24.2 % — ABNORMAL LOW (ref 35.0–47.0)
HGB: 8.1 g/dL — ABNORMAL LOW (ref 12.0–16.0)
Lymphocyte #: 0.6 10*3/uL — ABNORMAL LOW (ref 1.0–3.6)
Lymphocyte %: 17.3 %
MCH: 28.8 pg (ref 26.0–34.0)
Monocyte #: 0.5 x10 3/mm (ref 0.2–0.9)
Monocyte %: 14.9 %
Neutrophil #: 1.9 10*3/uL (ref 1.4–6.5)
Neutrophil %: 59.4 %
Platelet: 67 10*3/uL — ABNORMAL LOW (ref 150–440)
RBC: 2.81 10*6/uL — ABNORMAL LOW (ref 3.80–5.20)
WBC: 3.2 10*3/uL — ABNORMAL LOW (ref 3.6–11.0)

## 2012-09-30 LAB — COMPREHENSIVE METABOLIC PANEL
Anion Gap: 8 (ref 7–16)
BUN: 4 mg/dL — ABNORMAL LOW (ref 7–18)
Calcium, Total: 7.3 mg/dL — ABNORMAL LOW (ref 8.5–10.1)
Chloride: 101 mmol/L (ref 98–107)
Co2: 30 mmol/L (ref 21–32)
EGFR (African American): >60
EGFR (Non-African Amer.): >60
Osmolality: 274 (ref 275–301)
SGOT(AST): 36 U/L (ref 15–37)
SGPT (ALT): 16 U/L (ref 12–78)
Total Protein: 6 g/dL — ABNORMAL LOW (ref 6.4–8.2)

## 2012-09-30 LAB — BODY FLUID CULTURE

## 2012-10-01 LAB — COMPREHENSIVE METABOLIC PANEL
BUN: 4 mg/dL — ABNORMAL LOW (ref 7–18)
Bilirubin,Total: 1 mg/dL (ref 0.2–1.0)
Chloride: 104 mmol/L (ref 98–107)
Co2: 29 mmol/L (ref 21–32)
Creatinine: 0.94 mg/dL (ref 0.60–1.30)
EGFR (African American): >60
EGFR (Non-African Amer.): >60
Glucose: 79 mg/dL (ref 65–99)
Osmolality: 277 (ref 275–301)
Potassium: 3.7 mmol/L (ref 3.5–5.1)

## 2012-10-01 LAB — CBC WITH DIFFERENTIAL/PLATELET
Basophil %: 0.6 %
Eosinophil %: 7.1 %
HCT: 23.4 % — ABNORMAL LOW (ref 35.0–47.0)
HGB: 7.7 g/dL — ABNORMAL LOW (ref 12.0–16.0)
Lymphocyte #: 0.5 10*3/uL — ABNORMAL LOW (ref 1.0–3.6)
MCV: 86 fL (ref 80–100)
Monocyte %: 15.1 %
Neutrophil #: 1.7 10*3/uL (ref 1.4–6.5)
RBC: 2.73 10*6/uL — ABNORMAL LOW (ref 3.80–5.20)
WBC: 2.8 10*3/uL — ABNORMAL LOW (ref 3.6–11.0)

## 2012-10-01 LAB — VANCOMYCIN, TROUGH: Vancomycin, Trough: 13 ug/mL (ref 10–20)

## 2012-10-03 LAB — BASIC METABOLIC PANEL
Anion Gap: 9 (ref 7–16)
Calcium, Total: 7.5 mg/dL — ABNORMAL LOW (ref 8.5–10.1)
Co2: 28 mmol/L (ref 21–32)
EGFR (African American): >60
Glucose: 120 mg/dL — ABNORMAL HIGH (ref 65–99)

## 2012-10-03 LAB — VANCOMYCIN, TROUGH: Vancomycin, Trough: 19 ug/mL (ref 10–20)

## 2012-10-03 LAB — CULTURE, BLOOD (SINGLE)

## 2012-10-03 LAB — MAGNESIUM: Magnesium: 1.5 mg/dL — ABNORMAL LOW

## 2012-10-04 LAB — CREATININE, SERUM: Creatinine: 0.93 mg/dL (ref 0.60–1.30)

## 2012-10-08 LAB — CULTURE, BLOOD (SINGLE)

## 2012-11-09 LAB — COMPREHENSIVE METABOLIC PANEL
Albumin: 3.1 g/dL — ABNORMAL LOW (ref 3.4–5.0)
Alkaline Phosphatase: 117 U/L (ref 50–136)
Anion Gap: 7 (ref 7–16)
BUN: 4 mg/dL — ABNORMAL LOW (ref 7–18)
Calcium, Total: 8.2 mg/dL — ABNORMAL LOW (ref 8.5–10.1)
Co2: 24 mmol/L (ref 21–32)
EGFR (Non-African Amer.): >60
Glucose: 144 mg/dL — ABNORMAL HIGH (ref 65–99)
Osmolality: 268 (ref 275–301)
Potassium: 3.6 mmol/L (ref 3.5–5.1)
SGOT(AST): 36 U/L (ref 15–37)

## 2012-11-09 LAB — CBC
HCT: 25.9 % — ABNORMAL LOW (ref 35.0–47.0)
MCH: 26.2 pg (ref 26.0–34.0)
MCHC: 31.4 g/dL — ABNORMAL LOW (ref 32.0–36.0)
Platelet: 104 10*3/uL — ABNORMAL LOW (ref 150–440)
RBC: 3.1 10*6/uL — ABNORMAL LOW (ref 3.80–5.20)
RDW: 18.4 % — ABNORMAL HIGH (ref 11.5–14.5)
WBC: 2.6 10*3/uL — ABNORMAL LOW (ref 3.6–11.0)

## 2012-11-09 LAB — URINALYSIS, COMPLETE
Bilirubin,UR: NEGATIVE
Glucose,UR: NEGATIVE mg/dL (ref 0–75)
Leukocyte Esterase: NEGATIVE
Nitrite: NEGATIVE
Ph: 6 (ref 4.5–8.0)
Protein: 30
RBC,UR: 1 /HPF (ref 0–5)
Specific Gravity: 1.019 (ref 1.003–1.030)

## 2012-11-09 LAB — LIPASE, BLOOD: Lipase: 169 U/L (ref 73–393)

## 2012-11-09 LAB — PROTIME-INR
INR: 1.2
Prothrombin Time: 16.1 secs — ABNORMAL HIGH (ref 11.5–14.7)

## 2012-11-10 LAB — BODY FLUID CELL COUNT WITH DIFFERENTIAL
Basophil: 0 %
Eosinophil: 1 %
Other Mononuclear Cells: 71 %

## 2012-11-10 LAB — LACTATE DEHYDROGENASE, PLEURAL OR PERITONEAL FLUID: LDH, Body Fluid: 41 U/L

## 2012-11-10 LAB — APTT: Activated PTT: 36.5 secs — ABNORMAL HIGH (ref 23.6–35.9)

## 2012-11-10 LAB — PROTEIN, BODY FLUID: Protein, Body Fluid: 1 g/dL

## 2012-11-10 LAB — GLUCOSE, SEROUS FLUID: Glucose, Body Fluid: 109 mg/dL

## 2012-11-11 ENCOUNTER — Inpatient Hospital Stay: Payer: Self-pay | Admitting: Internal Medicine

## 2012-11-11 LAB — CBC WITH DIFFERENTIAL/PLATELET
Basophil #: 0 10*3/uL (ref 0.0–0.1)
Basophil %: 0.5 %
Eosinophil #: 0.2 10*3/uL (ref 0.0–0.7)
HCT: 25.1 % — ABNORMAL LOW (ref 35.0–47.0)
HGB: 8 g/dL — ABNORMAL LOW (ref 12.0–16.0)
Lymphocyte %: 7.7 %
MCHC: 31.9 g/dL — ABNORMAL LOW (ref 32.0–36.0)
Monocyte %: 14.7 %
Neutrophil %: 72.3 %
Platelet: 118 10*3/uL — ABNORMAL LOW (ref 150–440)
RDW: 19.1 % — ABNORMAL HIGH (ref 11.5–14.5)
WBC: 4.8 10*3/uL (ref 3.6–11.0)

## 2012-11-11 LAB — BASIC METABOLIC PANEL
Anion Gap: 5 — ABNORMAL LOW (ref 7–16)
Calcium, Total: 7.8 mg/dL — ABNORMAL LOW (ref 8.5–10.1)
Chloride: 103 mmol/L (ref 98–107)
Co2: 26 mmol/L (ref 21–32)
Glucose: 91 mg/dL (ref 65–99)
Osmolality: 265 (ref 275–301)
Potassium: 3.9 mmol/L (ref 3.5–5.1)

## 2012-11-11 LAB — MAGNESIUM: Magnesium: 1.4 mg/dL — ABNORMAL LOW

## 2012-11-12 LAB — VANCOMYCIN, TROUGH: Vancomycin, Trough: 11 ug/mL (ref 10–20)

## 2012-11-14 LAB — BODY FLUID CULTURE

## 2012-11-15 LAB — CULTURE, BLOOD (SINGLE)

## 2012-12-04 LAB — COMPREHENSIVE METABOLIC PANEL
Anion Gap: 13 (ref 7–16)
BUN: 7 mg/dL (ref 7–18)
Bilirubin,Total: 2 mg/dL — ABNORMAL HIGH (ref 0.2–1.0)
Chloride: 104 mmol/L (ref 98–107)
Co2: 24 mmol/L (ref 21–32)
Creatinine: 0.81 mg/dL (ref 0.60–1.30)
EGFR (African American): >60
Osmolality: 279 (ref 275–301)
Potassium: 3.2 mmol/L — ABNORMAL LOW (ref 3.5–5.1)
SGPT (ALT): 22 U/L (ref 12–78)
Sodium: 141 mmol/L (ref 136–145)
Total Protein: 7.8 g/dL (ref 6.4–8.2)

## 2012-12-04 LAB — URINALYSIS, COMPLETE
Blood: NEGATIVE
Glucose,UR: NEGATIVE mg/dL (ref 0–75)
Ph: 7 (ref 4.5–8.0)
Protein: 100
RBC,UR: 2 /HPF (ref 0–5)
Specific Gravity: 1.029 (ref 1.003–1.030)
Squamous Epithelial: 22

## 2012-12-04 LAB — CBC
HCT: 24.2 % — ABNORMAL LOW (ref 35.0–47.0)
HGB: 7.6 g/dL — ABNORMAL LOW (ref 12.0–16.0)
MCH: 25.3 pg — ABNORMAL LOW (ref 26.0–34.0)
MCHC: 31.5 g/dL — ABNORMAL LOW (ref 32.0–36.0)
MCV: 80 fL (ref 80–100)
Platelet: 40 10*3/uL — ABNORMAL LOW (ref 150–440)
RBC: 3.01 10*6/uL — ABNORMAL LOW (ref 3.80–5.20)
RDW: 18.8 % — ABNORMAL HIGH (ref 11.5–14.5)
WBC: 1.5 10*3/uL — CL (ref 3.6–11.0)

## 2012-12-04 LAB — LIPASE, BLOOD: Lipase: 372 U/L (ref 73–393)

## 2012-12-04 LAB — TROPONIN I: Troponin-I: <0.02 ng/mL

## 2012-12-04 LAB — CK TOTAL AND CKMB (NOT AT ARMC): CK-MB: 0.7 ng/mL (ref 0.5–3.6)

## 2012-12-04 LAB — AMMONIA: Ammonia, Plasma: <25 mcmol/L (ref 11–32)

## 2012-12-05 ENCOUNTER — Inpatient Hospital Stay: Payer: Self-pay | Admitting: Family Medicine

## 2012-12-05 LAB — BASIC METABOLIC PANEL
Anion Gap: 8 (ref 7–16)
Calcium, Total: 7.6 mg/dL — ABNORMAL LOW (ref 8.5–10.1)
Co2: 27 mmol/L (ref 21–32)
Creatinine: 0.89 mg/dL (ref 0.60–1.30)
EGFR (African American): >60
EGFR (Non-African Amer.): >60
Glucose: 117 mg/dL — ABNORMAL HIGH (ref 65–99)
Sodium: 137 mmol/L (ref 136–145)

## 2012-12-05 LAB — CBC WITH DIFFERENTIAL/PLATELET
Basophil #: 0 10*3/uL (ref 0.0–0.1)
Basophil %: 0.6 %
Eosinophil #: 0 10*3/uL (ref 0.0–0.7)
HCT: 23.9 % — ABNORMAL LOW (ref 35.0–47.0)
HGB: 7.4 g/dL — ABNORMAL LOW (ref 12.0–16.0)
Lymphocyte #: 0.3 10*3/uL — ABNORMAL LOW (ref 1.0–3.6)
MCH: 25.3 pg — ABNORMAL LOW (ref 26.0–34.0)
MCHC: 31.1 g/dL — ABNORMAL LOW (ref 32.0–36.0)
MCV: 81 fL (ref 80–100)
Monocyte #: 0.2 x10 3/mm (ref 0.2–0.9)
Neutrophil #: 2.4 10*3/uL (ref 1.4–6.5)

## 2012-12-05 LAB — MAGNESIUM: Magnesium: 1.5 mg/dL — ABNORMAL LOW

## 2012-12-06 LAB — BASIC METABOLIC PANEL
BUN: 11 mg/dL (ref 7–18)
Calcium, Total: 7.7 mg/dL — ABNORMAL LOW (ref 8.5–10.1)
Co2: 23 mmol/L (ref 21–32)
Creatinine: 1.05 mg/dL (ref 0.60–1.30)
EGFR (African American): >60
Potassium: 3.2 mmol/L — ABNORMAL LOW (ref 3.5–5.1)
Sodium: 131 mmol/L — ABNORMAL LOW (ref 136–145)

## 2013-07-24 DEATH — deceased

## 2014-06-10 IMAGING — CR DG CHEST 1V PORT
1 series · 1 of 1 positions shown · non-contrast
Comparison: none

REASON FOR EXAM: weakness
COMMENTS:

[portable]
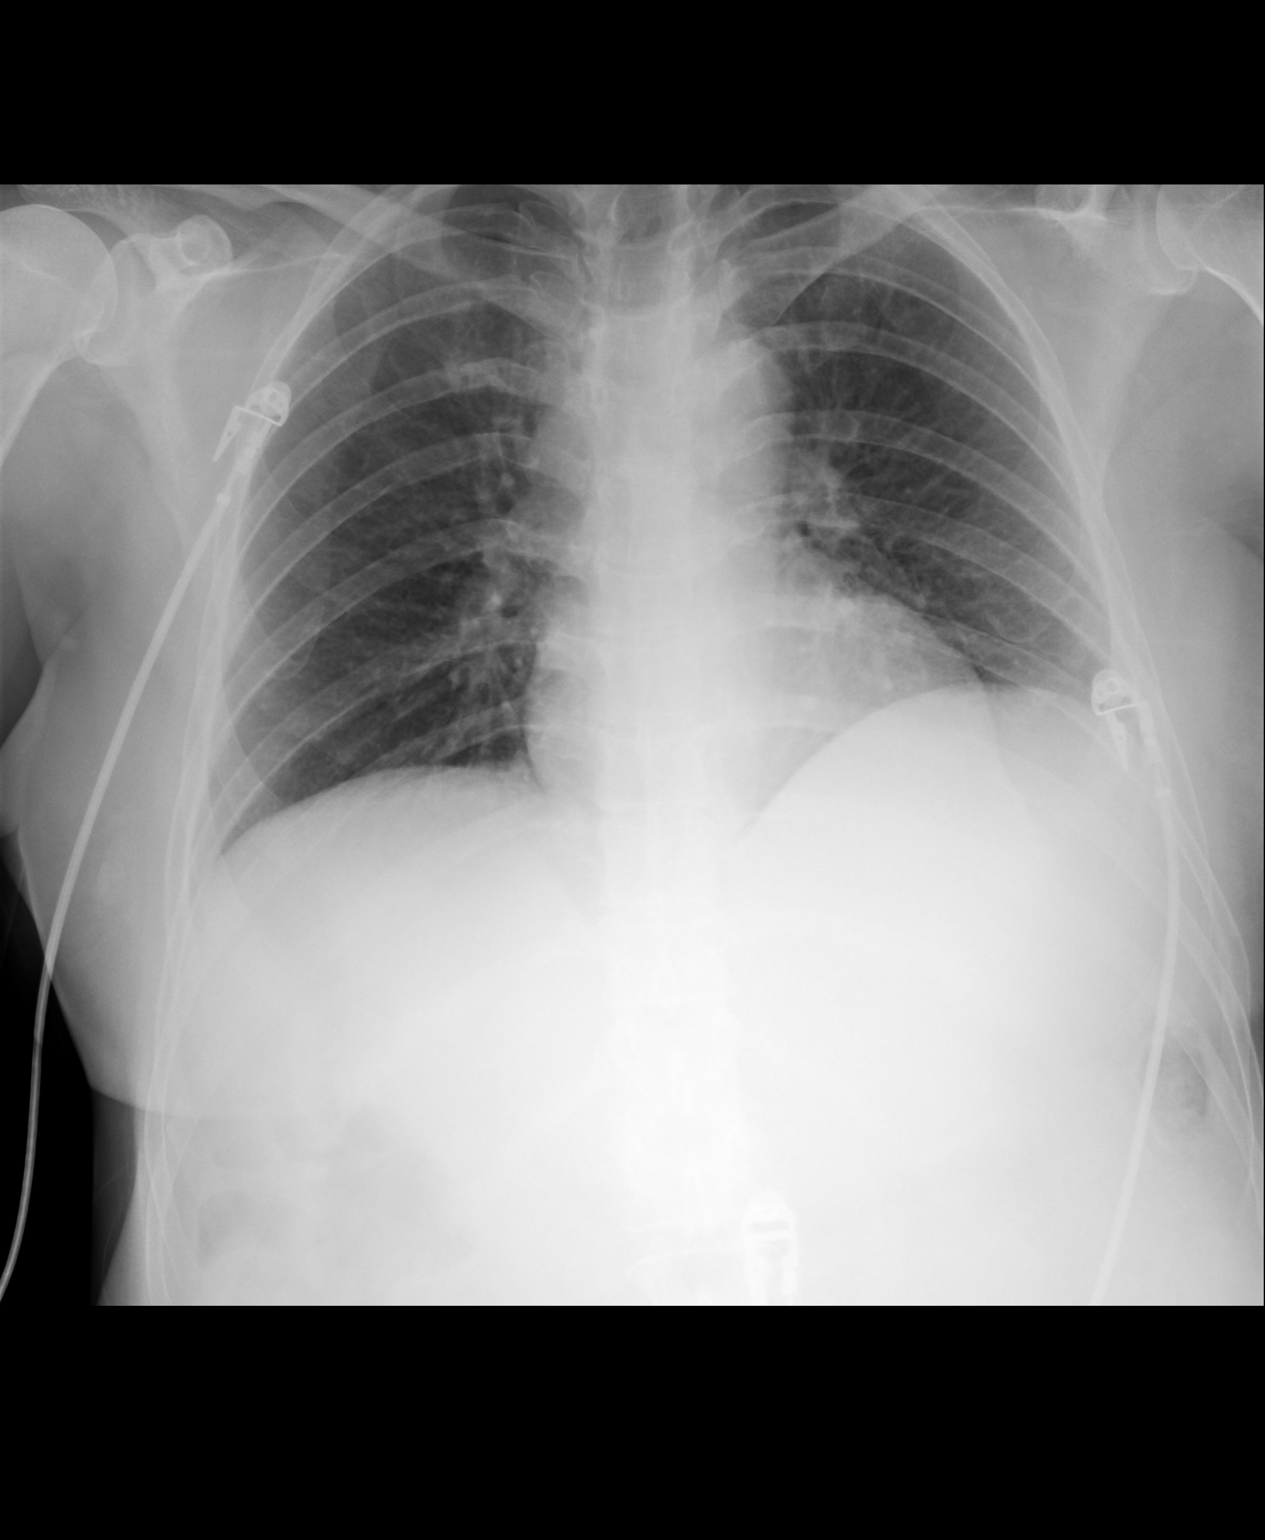

[1 of 1 positions shown; findings below may reference images not displayed]

PROCEDURE:     DXR - DXR PORTABLE CHEST SINGLE VIEW  - August 28, 2012  [DATE]

RESULT:     Comparison is made with study 23 August, 2011.

Cardiac monitoring electrodes are present.  The lungs are clear. The heart
and pulmonary vessels are normal. The bony and mediastinal structures are
unremarkable. There is no effusion. There is no pneumothorax or evidence of
congestive failure.
IMPRESSION: No acute cardiopulmonary disease.

[REDACTED]

## 2014-06-13 IMAGING — CR DG CHEST 1V PORT
1 series · 1 of 1 positions shown · non-contrast
Comparison: none

REASON FOR EXAM: PICC line placement
COMMENTS:

[portable]
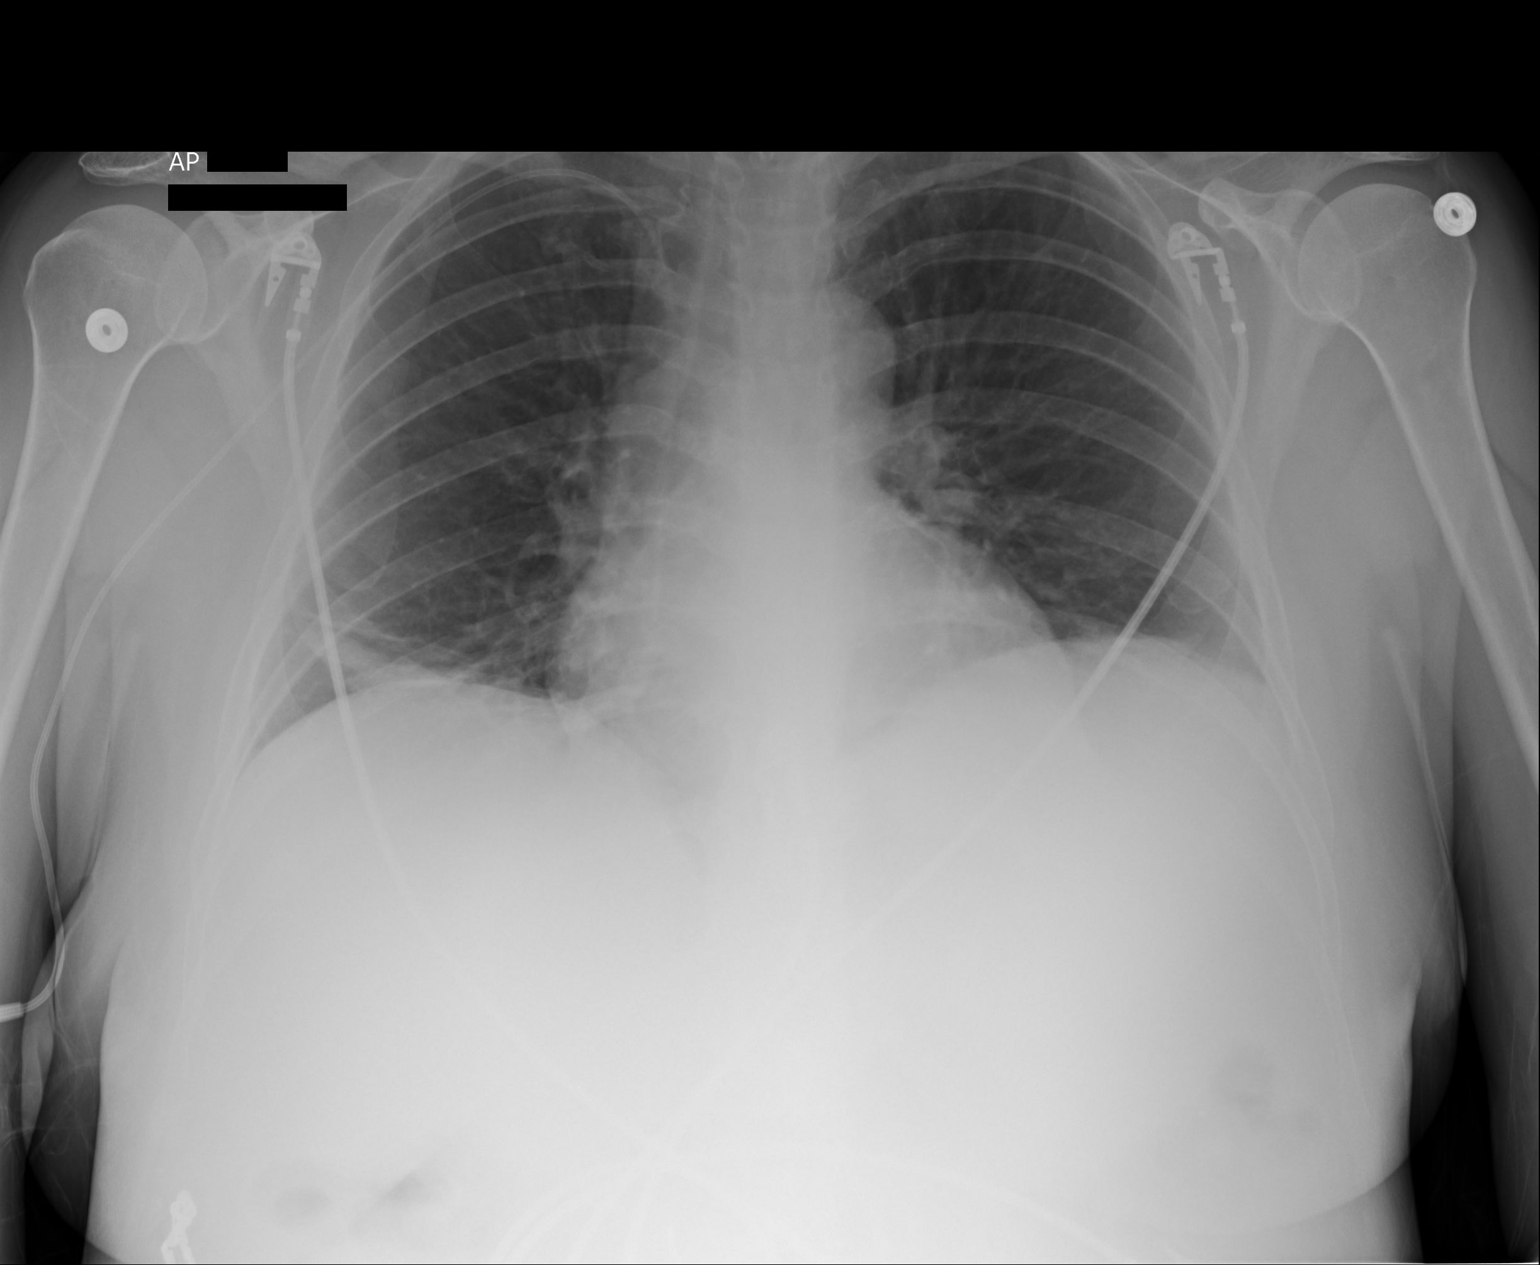

[1 of 1 positions shown; findings below may reference images not displayed]

PROCEDURE:     DXR - DXR PORTABLE CHEST SINGLE VIEW  - August 31, 2012  [DATE]

RESULT:     The patient has undergone placement of a PICC line on the right.
The lungs are mildly hypoinflated. There is basilar atelectasis on the right
which appears new. The cardiac silhouette is normal in size. The pulmonary
vascularity is not engorged. There is no pneumothorax or pneumomediastinum
or pleural effusion.
IMPRESSION: 1. There is atelectasis at the right lung base.
2. The PICC line is present via the right upper extremity with the tip in
the region of the junction of the SVC with the right atrium.

[REDACTED]

## 2014-09-16 IMAGING — CR DG CHEST 2V
1 series · 3 of 3 positions shown · non-contrast
Comparison: none

REASON FOR EXAM: sob
COMMENTS:

PROCEDURE:     DXR - DXR CHEST PA (OR AP) AND LATERAL  - December 04, 2012  [DATE]
RESULT:     Lungs are clear.
Cardiovascular structures are unremarkable.

[Series 1: w chest pa · 0.14mm/px · 3 of 3 slices shown]
[im 1/3]
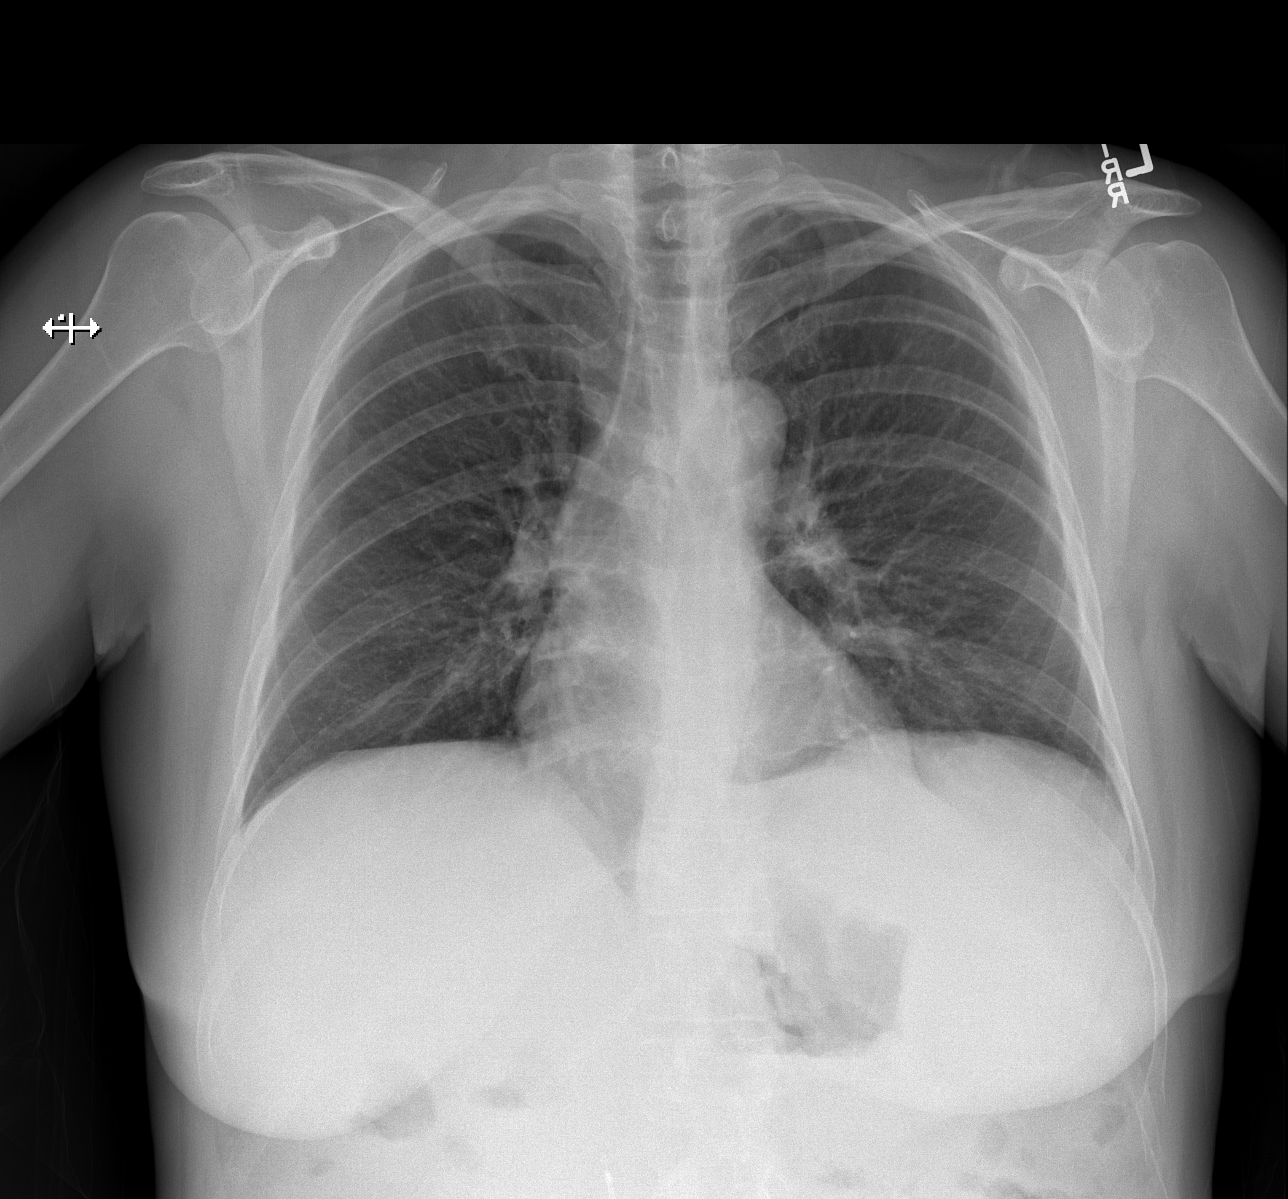
[im 2/3]
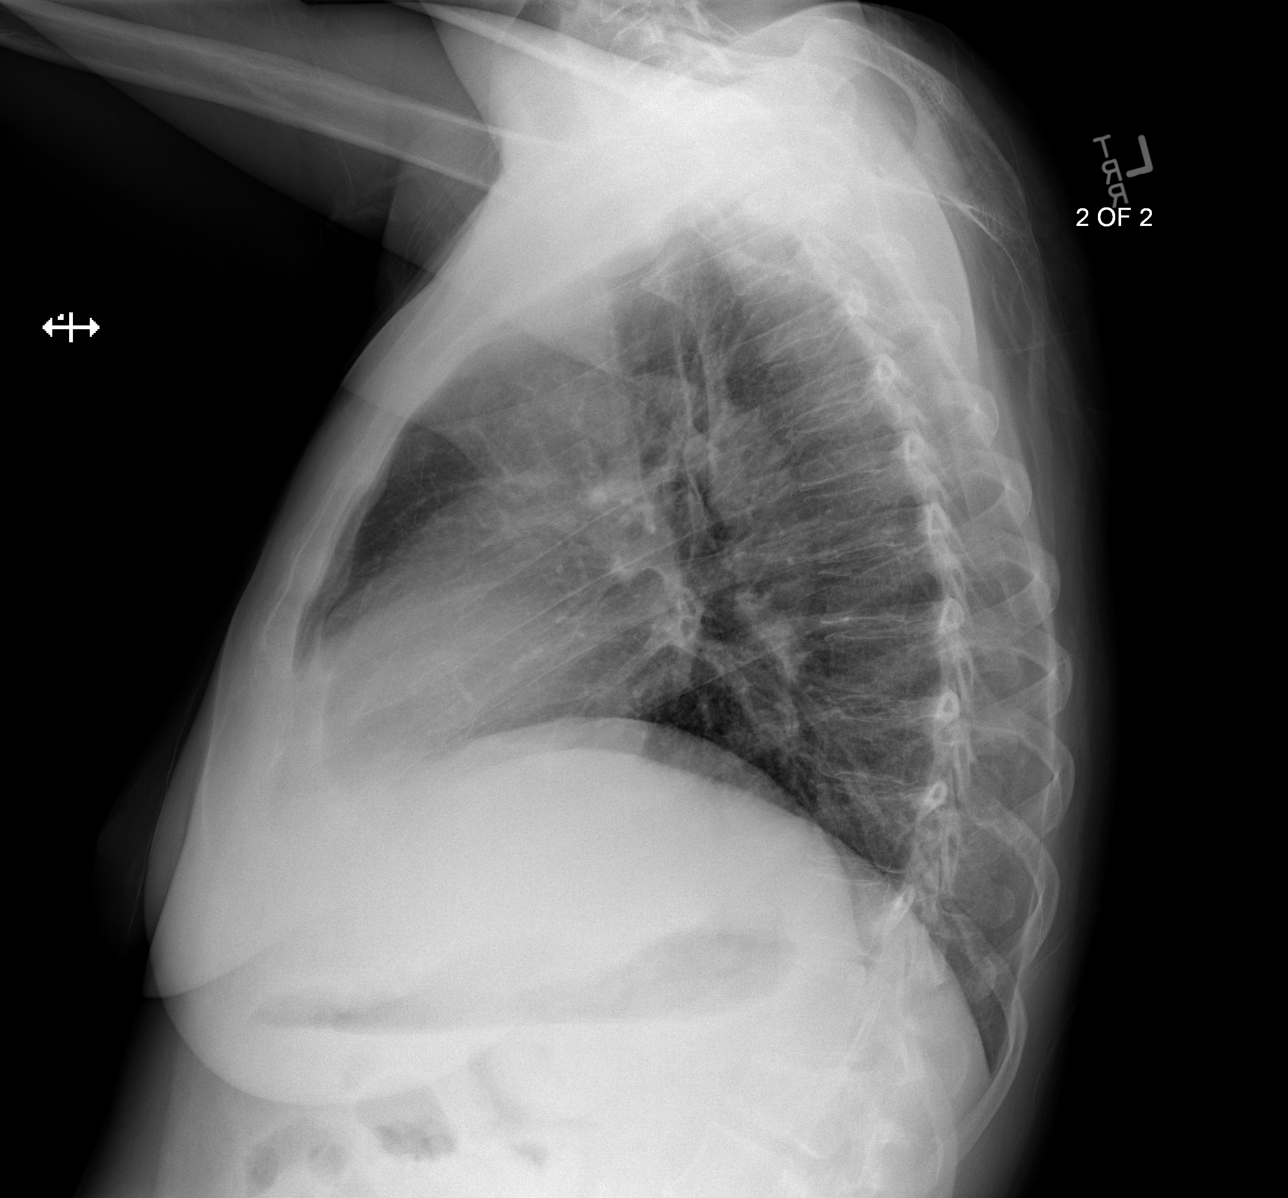
[im 3/3]
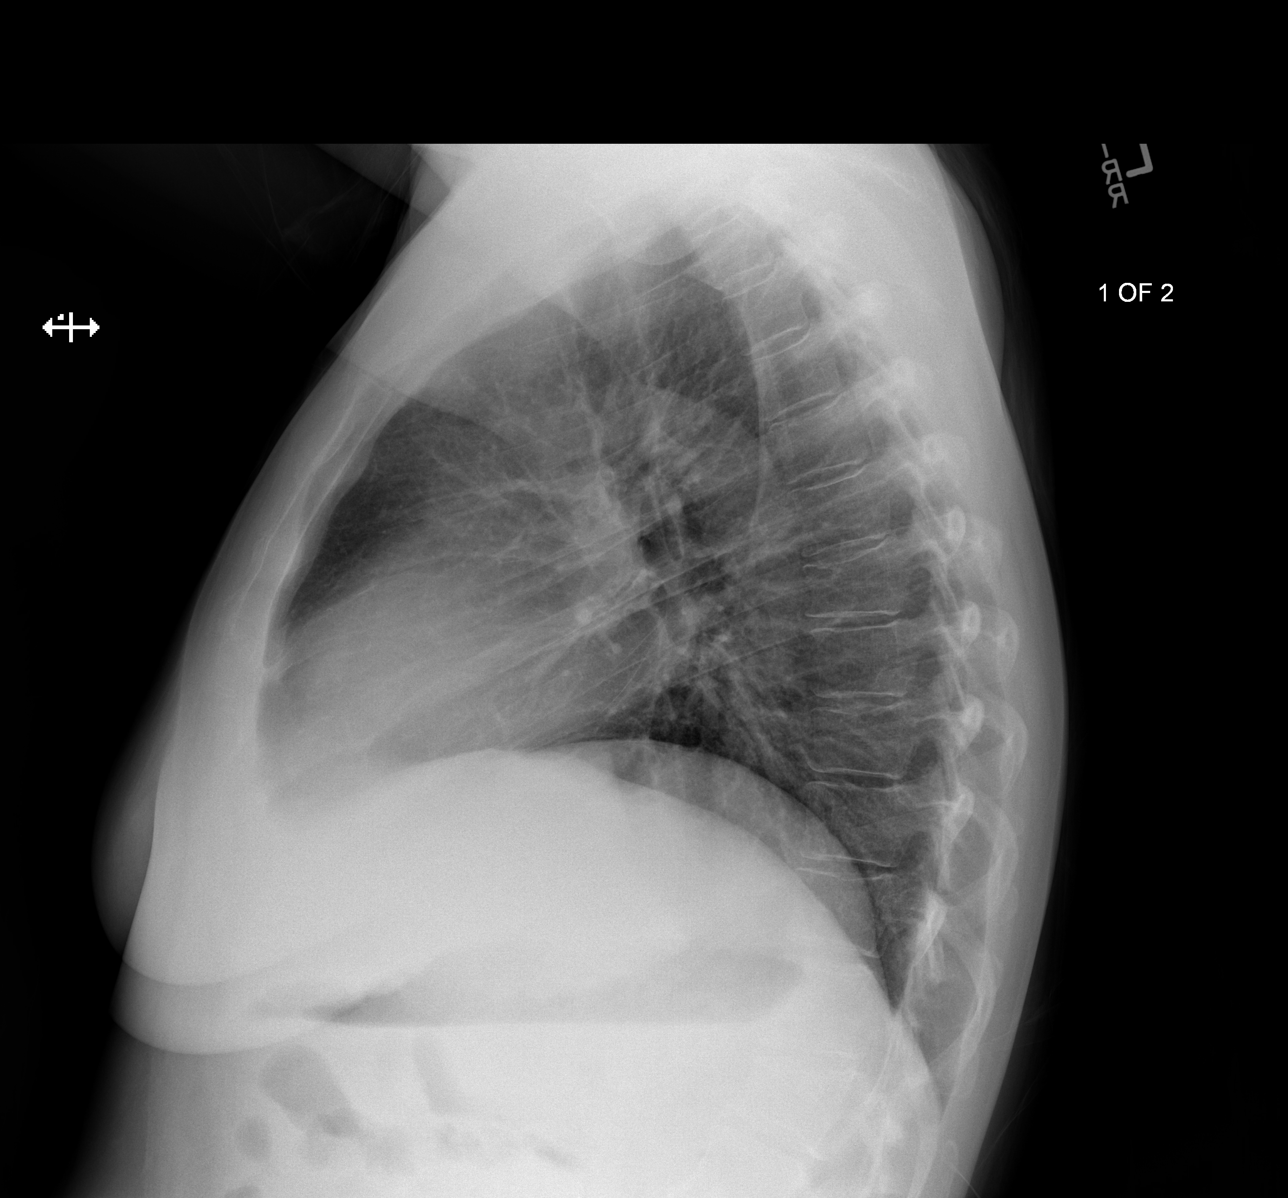

[3 of 3 positions shown; findings below may reference images not displayed]

IMPRESSION: No acute abnormality. Previously identified atelectasis in
lung bases on chest x-ray of 09/26/2012 is clear.

## 2014-09-17 IMAGING — CR DG CHEST 1V PORT
1 series · 2 of 2 positions shown · non-contrast
Comparison: none

REASON FOR EXAM: hypoxia
COMMENTS:

[Series 1: ap · 0.17mm/px · 2 of 2 slices shown]
[im 1/2]
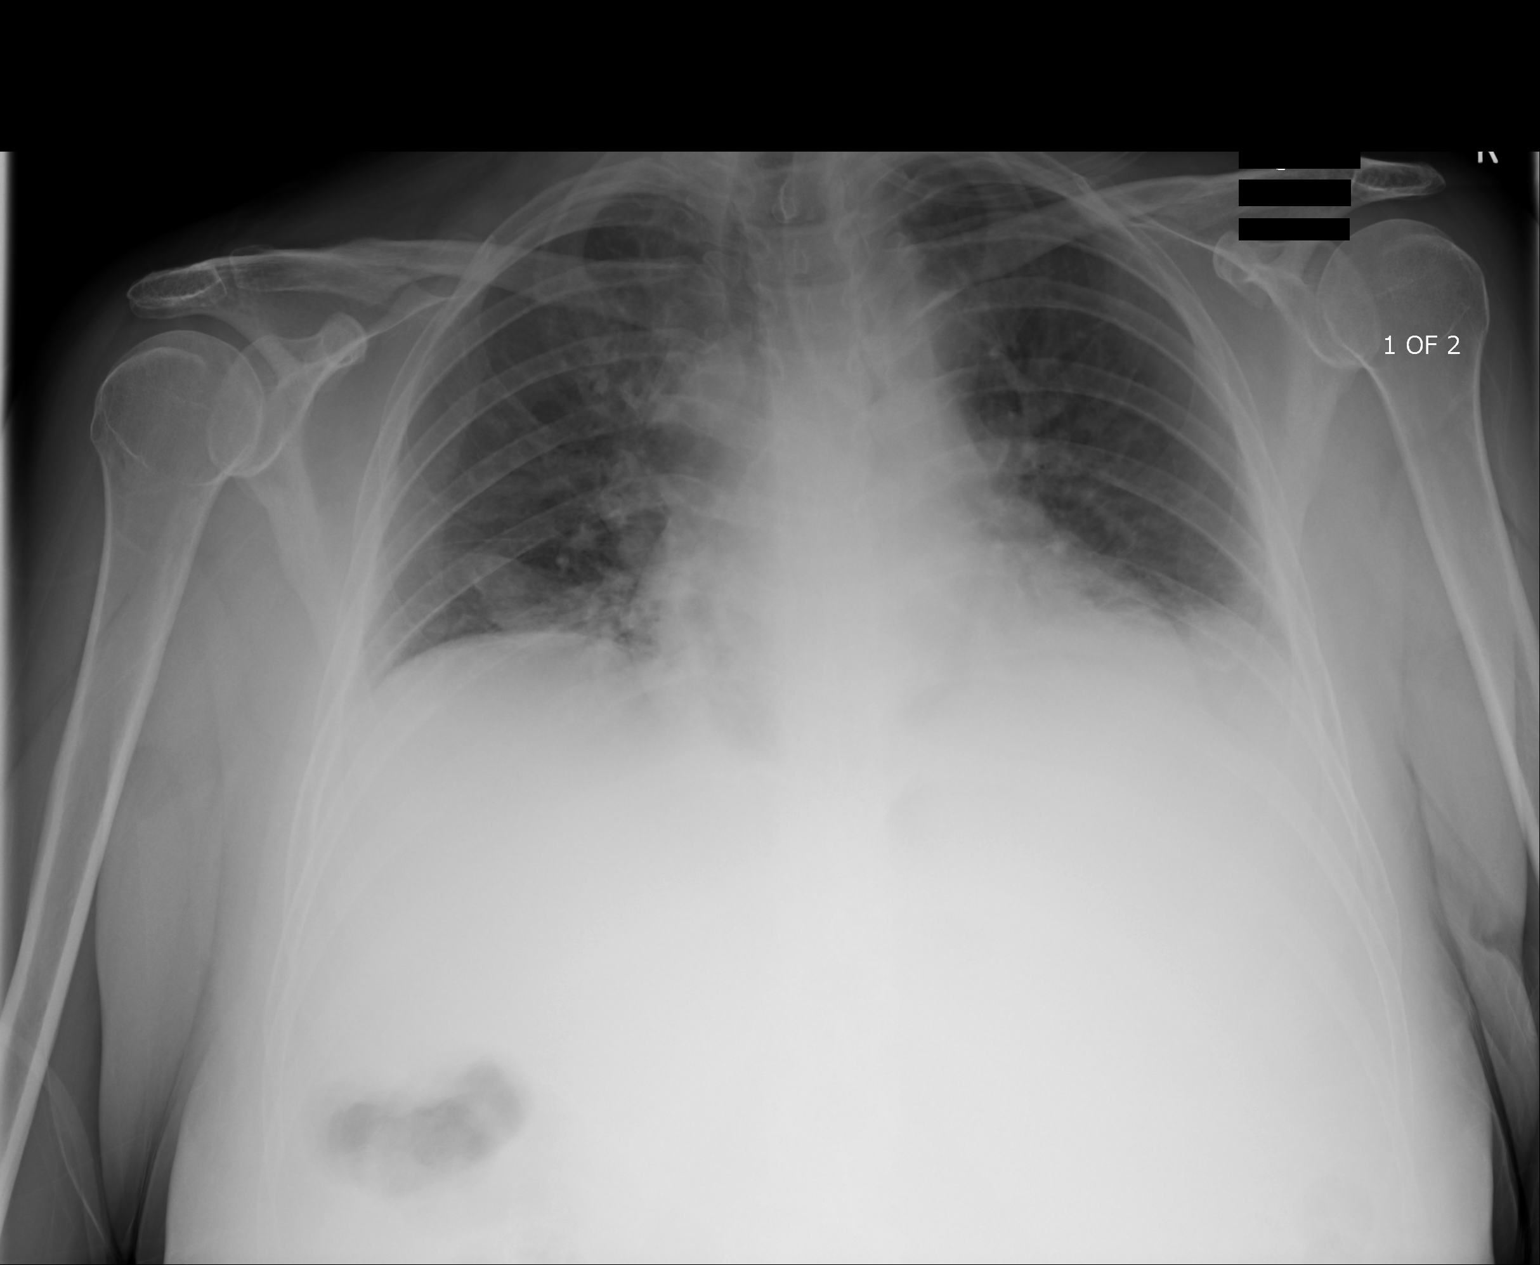
[im 2/2]
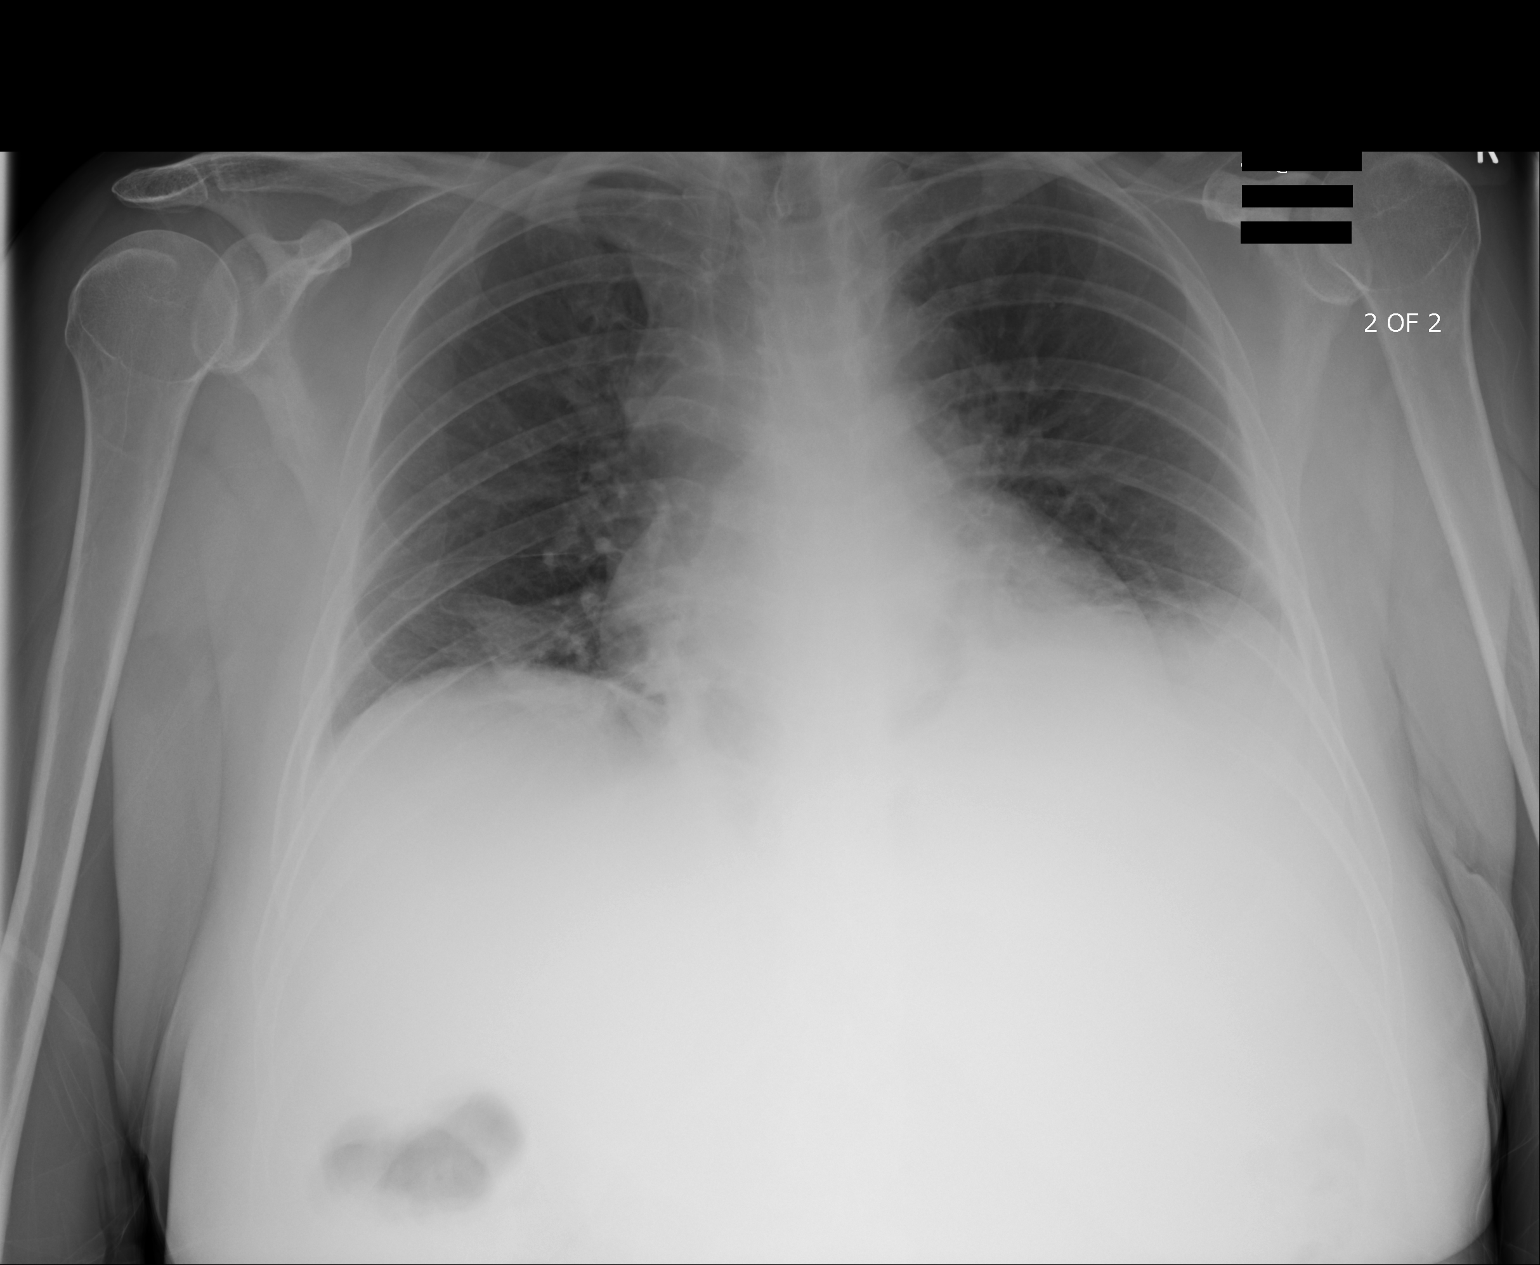

[2 of 2 positions shown; findings below may reference images not displayed]

PROCEDURE:     DXR - DXR PORTABLE CHEST SINGLE VIEW  - December 05, 2012  [DATE]

RESULT:     Comparison is made to the most recent exam of 04 December, 2012.

There is significantly decreased inspiratory effort. Bibasalar areas of
atelectasis versus infiltrate are present with small pleural effusions. No
overt is interstitial edema or pneumothorax is evident. The heart size
appears normal.
IMPRESSION: Hypoinflation with presumed basilar atelectasis
bilaterally. Infiltrate is felt to be less likely. A trace pleural effusion
is not excluded.

[REDACTED]
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
## 2015-04-12 NOTE — Consult Note (Signed)
CC: cirrhosis.  Pt very lethargic today, barely arousable by voice and movement of limbs.  Sclera neg, neck supple, chest clear, abd palpable spleen? WBC 10, hgb 8.6, plt 100, PT 21.8.  EGD could not be done due to urine positive for cocaine.  No new suggestions.  Appears that dose of pain medicine could be reduced if her lethargy is due to the morning dose.  Electronic Signatures: Scot JunElliott, Belen Zwahlen T (MD)  (Signed on 07-Sep-13 12:17)  Authored  Last Updated: 07-Sep-13 12:17 by Scot JunElliott, Anica Alcaraz T (MD)
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
## 2015-04-12 NOTE — Consult Note (Signed)
Chief Complaint:   Subjective/Chief Complaint Fairly alert. Some abd pain. No nausea/vominting. No hematemesis or melena.   VITAL SIGNS/ANCILLARY NOTES: **Vital Signs.:   09-Sep-13 11:43   Vital Signs Type Routine   Temperature Temperature (F) 98.4   Celsius 36.8   Temperature Source Oral   Pulse Pulse 77   Respirations Respirations 20   Systolic BP Systolic BP 109   Diastolic BP (mmHg) Diastolic BP (mmHg) 73   Mean BP 85   Pulse Ox % Pulse Ox % 95   Pulse Ox Activity Level  At rest   Oxygen Delivery Room Air/ 21 %   Brief Assessment:   Cardiac Regular    Respiratory clear BS    Gastrointestinal mild abd tenderness   Assessment/Plan:  Assessment/Plan:   Assessment UGI bleeding, stopped. No plan for EGD unless patient has bleeding again.    Plan Low residue diet ordered. Continue lactulose. Continue PPI. [facility]. If patient vomits food with coffee ground emesis again, then will plan EGD. Otherwise, continue current management. Needs alcohol rehab after discharge. THanks   Electronic Signatures: Lutricia Feilh, Adelia Baptista (MD)  (Signed 09-Sep-13 13:49)  Authored: Chief Complaint, VITAL SIGNS/ANCILLARY NOTES, Brief Assessment, Assessment/Plan   Last Updated: 09-Sep-13 13:49 by Lutricia Feilh, Carrington Olazabal (MD)

## 2015-04-12 NOTE — Discharge Summary (Signed)
PATIENT NAME:  Erica Combs, Erica Combs MR#:  [id number] DATE OF BIRTH:  1971/12/06  DATE OF ADMISSION:  08/28/2012 DATE OF DISCHARGE:  09/05/2012  ADMITTING DIAGNOSIS: Hematemesis.   DISCHARGE DIAGNOSES:  1. Hematemesis, status post esophagogastroduodenoscopy on 09/03/2012 by Dr. Bluford Kaufmann. Grade 1 esophageal varices were noted as well as portal gastropathy.  2. Alcoholic liver cirrhosis with ascites, lower extremity swelling, coagulopathy, hepatic encephalopathy.  3. Hypokalemia.  4. Hypomagnesemia.  5. Anemia.  6. Acute GI blood loss status post 3 units of packed red blood cell transfusion.  7. Acute alcoholic pancreatitis.  8. Suspected spontaneous bacterial peritonitis.  9. Alcohol, opiates, cocaine abuse.  10. Lung atelectasis.   DISCHARGE CONDITION: Stable.   DISCHARGE MEDICATIONS: The patient is to resume her outpatient medications which are:  1. Potassium chloride 10 mEq p.o. daily.  2. Neomycin 500 mg p.o. 4 times daily.  3. Thiamine 100 mg p.o. daily. 4. Nadolol 10 mg p.o. daily.  5. Paroxetine 20 mg p.o. daily. 6. Abilify 2 mg p.o. daily.  7. Lexapro 20 mg 2 tablets once daily.   8. Calcium with vitamin D 600/200 units, 1 tablet 3 times daily.  9. Magnesium oxide 400 mg p.o. daily.  10. Spironolactone 100 mg p.o. daily. This is a new dose.  11. Keflex 500 mg p.o. every eight hours, which is 3 times daily for 10 days.  12. Pancrelipase 12,000-unit capsule, 6 capsules 3 times daily with meals.  13. Furosemide 20 mg p.o. twice daily.  14. Lactulose 30 mL 3 times daily.  15. Omeprazole 40 mg p.o. twice daily.  16. Oxycodone 5 mg p.o. every six hours as needed, 30 pills given.   DIET: The patient was advised to continue two-gram salt, soft, low residue diet. The patient was also advised to have low fat diet. No alcohol.   ACTIVITY: As tolerated.   REFERRAL: Home health physical therapy as well as R.N.   FOLLOWUP: The patient is to follow up with her primary care physician Dr.  Opal Sidles at [facility] internal medicine clinic in the next few days after discharge.   CONSULTANTS:  1. Dr. Lutricia Feil, GI. 2. Care management. 3. Dr. Lynnae Prude, GI.  PROCEDURES: Upper GI endoscopy done on 09/03/2012 revealing grade 1 esophageal varices, medium amount of food residue in the stomach, portal hypertensive gastropathy, normal examined duodenum.   RADIOLOGIC STUDIES:  1. CT of abdomen without contrast 08/30/2012 showed findings consistent with hepatic cirrhosis. Volume of ascites has increased since earlier study.  Marked  splenomegaly. No acute bowel abnormality noted.  2. CT of the head without contrast 08/30/2012 showed no acute intracranial abnormality.  3. Portable chest x-ray 08/31/2012 showed atelectasis of the right lung base. PICC line is present in the right upper extremity with the tip in the region of the junction of the SVC and right atrium.   HISTORY OF PRESENT ILLNESS: The patient is a 44 year old female with past medical history significant for history of ongoing alcohol abuse who presented to the hospital with complaints of throwing up blood, abdominal pain, and confusion. Please refer to Dr. Eliane Decree admission note on 08/28/2012. On arrival to the Emergency Room the patient was complaining of epigastric abdominal pain as well as left lower quadrant abdominal pain. She was also complaining of hematemesis and in the Emergency Room she had a bright red color emesis. She was slightly hypotensive as well as tachycardic. Physical examination showed mild tenderness, distention, and positive bowel sounds.   LABORATORY DATA: On  08/28/2012 the patient's lab data revealed elevated glucose of 103, BUN 35, sodium 135, potassium 3.3, estimated GFR for African American would be 60, non-African American would be 52. The patient's ammonia level was elevated at 35. Calcium level was low at 7.9. Albumin was low at 2.7, total bilirubin was 2.7, AST 39. Cardiac enzymes- one set was  done and was negative. Urine drug screen was positive for cocaine as well as opiates. CBC: White blood cell count was 7.7, hemoglobin was 9.4.  On arrival to the hospital platelet count  was 151.  Absolute neutrophil count was normal at 6.5. The  patient's proTime was elevated to 19.9. INR was 1.7 and activated PTT was 33.7. Urinalysis showed yellow clear urine, negative for glucose or bilirubin, trace ketones, specific gravity 1.019, pH 6, negative for blood, protein, nitrites, or leukocyte esterase, one red blood cell, less than one white blood cell. No bacteria were seen. Pregnancy test in urine was negative. ABGs were done on room air and showed pH of 7.58, pO2 38, pO2 43, saturation was 35.6%. That, however, was a venous study. Lactic acid level was elevated to 5.6. EKG showed sinus tachycardia at 108 beats per minute. No acute ST-T changes were noted. Because of the patient's confusion she had a CT scan of her head which was unremarkable. It was felt that the patient's confusion was due to hepatic encephalopathy.   DISCUSSION BY PROBLEM: 1. Hematemesis:  It was felt the patient's hematemesis was very likely related to esophageal varices as well as portal gastropathy, which was treated with octreotide and proton pump inhibitors IV. With this therapy the patient improved. She is to continue proton pump inhibitors as an outpatient. She is also to continue her usual outpatient medications such as nadolol, thiamine, Lasix, and spironolactone, dose of which was increased for her significant ascites.  Initially the patient was not able to eat much because of abdominal pain. She was found to have pancreatitis. Her lipase level was found to be slightly elevated at around 400-500. However, with time the patient's pain subsided and she was able to eat progressively better. She was felt to be stable to be discharged as her lipase level did not worsen with oral intake. The patient is to continue pain medications as well  as soft, low fat, low cholesterol diet and follow up with her primary care physician for further recommendations. 2. In regards to alcoholic liver cirrhosis and ascites the patient is to continue, as mentioned above, diuretics. Spironolactone dose was increased to 100 mg p.o. daily dose.  3. For hepatic encephalopathy the patient is to continue lactulose. 4. The patient was noted to be hypokalemic as well as hypomagnesemic in the hospital and those  were supplemented. The patient is to continue potassium and magnesium supplements at home. It is recommended to follow the patient's potassium and magnesium levels as outpatient.  5. The patient had hematemesis and developed acute posthemorrhagic anemia. She required 3 units of packed red blood cell transfusion and her hemoglobin level improved. On day of discharge the patient's hemoglobin level is 7.9. It is recommended to follow the patient's hemoglobin level as an outpatient and make sure that her hemoglobin level improves with iron supplementation. No bleeding was noted.  6. In regards to alcoholic pancreatitis as mentioned above, the patient is to continue low fat diet and medications as necessary. She is to follow up with her primary care physician for recommendations.  7. For suspected spontaneous bacterial peritonitis the patient is  to continue antibiotic therapy for 10 days.  8. For history of alcohol, opiate, and cocaine abuse the patient was counseled and she agreed to quit.  9. The patient was noted to have lung atelectasis on chest x-ray. However, she had no symptoms whatsoever and did not have repeat x-ray done upon discharge as it was felt to be not necessary.   The patient is being discharged in stable condition with the above-mentioned medications and followup. Her vital signs on the day of discharge: Temperature 98.3, pulse 88 to 105, respiratory rate 16 to 18, blood pressure fluctuating from 97 systolic to 121 systolic, 60s to 16X diastolic.  Oxygen saturation 97% on room air at rest.   TIME SPENT: 40 minutes.  ____________________________ Katharina Caper, MD rv:bjt D: 09/05/2012 16:55:00 ET T: 09/06/2012 10:52:01 ET JOB#: [id number]  cc: Katharina Caper, MD, <Dictator> [facility] Internal Medicine- Dr. Aldean Ast MD ELECTRONICALLY SIGNED 09/14/2012 18:27

## 2015-04-12 NOTE — Consult Note (Signed)
Chief Complaint:   Subjective/Chief Complaint C/O abd pain. Getting blood transfusions. No more vomiting. Pancreatitis present.   VITAL SIGNS/ANCILLARY NOTES: **Vital Signs.:   06-Sep-13 06:55   Vital Signs Type Blood Transfusion   Temperature Temperature (F) 98.3   Celsius 36.8   Temperature Source oral   Pulse Pulse 100   Respirations Respirations 18   Systolic BP Systolic BP 90   Diastolic BP (mmHg) Diastolic BP (mmHg) 55   Mean BP 66   Pulse Ox % Pulse Ox % 96   Oxygen Delivery Room Air/ 21 %   Brief Assessment:   Cardiac Regular    Respiratory clear BS    Gastrointestinal epig tenderness   Assessment/Plan:  Assessment/Plan:   Assessment Pancreatitis. UGI bleeding, which has stopped for now. On octreotide and PPI drip. NPO.    Plan Keep NPO. Plan EGD this AM. Thanks   Electronic Signatures: Lutricia Feilh, Perrin Gens (MD)  (Signed 06-Sep-13 08:31)  Authored: Chief Complaint, VITAL SIGNS/ANCILLARY NOTES, Brief Assessment, Assessment/Plan   Last Updated: 06-Sep-13 08:31 by Lutricia Feilh, Ivyrose Hashman (MD)

## 2015-04-12 NOTE — Consult Note (Signed)
Pt acting more confused and lethargic than this AM. We had to cancel EGD due to cocaine in her system. Cocaine can adversely affect anethesia she will receive. Thus, anethesia cancelled the procedure. Discussed with husband. According to husband, patient has refused any help in the past overcoming her addictions. We are limited in what we can offer as long as she either refuses help and not take her necessary meds. Will start clears. Start lactulose. Watch for DT's. DT prophylaxis. Dr.Elliott to cover this weekend. THanks.   Electronic Signatures: Lutricia Feilh, Jermell Holeman (MD)  (Signed on 06-Sep-13 11:33)  Authored  Last Updated: 06-Sep-13 11:33 by Lutricia Feilh, Ziaire Bieser (MD)

## 2015-04-12 NOTE — Consult Note (Signed)
Impression: 44yo WF w/ h/o alcoholic cirrhosis, esophageal bleeding, hepatic encephalopathy admitted with abd pain who now has fever and MS changes.  Her fever began a few days after she was admitted, but she reports some low grade fever at home.  Her paracentesis showed <250 WBCs.  The culture is unlikely to be helpful as she was already on antibiotics.  Given the relatively few cells present,  SBP is less likely.   BCx from admission and yesterday are negative.   Will send u/a and culture. Check CXR. Will continue zosyn for now. Will check an HIV antibody test.   Electronic Signatures: Shreeya Recendiz, Rosalyn GessMichael E (MD) (Signed on 04-Oct-13 15:12)  Authored   Last Updated: 04-Oct-13 15:24 by Johniya Durfee, Rosalyn GessMichael E (MD)

## 2015-04-12 NOTE — H&P (Signed)
PATIENT NAME:  Erica Combs, Erica Combs MR#:  [id number] DATE OF BIRTH:  Nov 10, 1971  DATE OF ADMISSION:  09/23/2012  CHIEF COMPLAINT: Stomach pain.   HISTORY OF PRESENT ILLNESS: 44 year old Caucasian female with history of alcohol dependence, liver cirrhosis, with varices, recent hospital admission and discharge for upper GI bleed due to variceal bleeding presents with increasing abdominal pain and abdominal girth. Patient was hospitalized from 09/05 through 09/13 for hematemesis and she reports that since discharge she has had increasing abdominal pain and swelling diffusely, points particularly to the right lower quadrant. At home the pain was severe, greater than 10, caused her to cry. It would vary between being sharp, intermittent and then a dull constant ache. Nothing relieved the pain. She presents because the pain is too severe to tolerate at home. She notes that she did have an episode of fever two nights ago and it was 101. She has associated nausea without emesis. She has weighed herself, but her weight has not been consistently elevated nor decreased. She was discharged on Keflex for concern of spontaneous bacterial peritonitis, however, husband notes that this prescription was not filled due to financial reasons and that actually most of the discharge medications were not filled due to financial reasons. She simply continued the medications that she had at home which included neomycin, magnesium, spironolactone and furosemide and lactulose. She might have also taken some Vistaril for sleep.   In the Emergency Department we are called to admit for concern of spontaneous bacterial peritonitis due to increased ascites.   PAST MEDICAL HISTORY:  1. Significant for alcoholic liver cirrhosis with ascites, lower extremity swelling, coagulopathy. 2. Gastritis. 3. Grade 1 esophageal varices status post recent upper GI bleed.  4. Portal gastropathy.  5. Hospital course 09/05 to 09/13 was complicated by  hepatic encephalopathy.  6. History of renal failure.  7. Pancytopenia. 8. Coagulopathy.  9. History of pancreatitis.  10. Depression.  11. History of cocaine abuse.  12. Patient had recent acute alcoholic pancreatitis during the hospital stay 09/05 through 09/13. She was also suspected to have spontaneous bacterial peritonitis.  13. Alcohol, opiates and cocaine abuse.  14. Lung atelectasis.   15. Hypoalbuminemia.  16. History of anasarca.  17. History of small posttraumatic intracranial hemorrhage.  18. History of splenic rupture status post surgical repair. 19. History of alcohol withdrawal seizures.  20. History of thigh hematoma. 21. Also broke her jaw. 22. Also question of subdural hematoma.   CURRENT MEDICATIONS:  1. Patient admits that she was taking neomycin 500 mg. She is taking this medication maybe once or twice since discharge.  2. Magnesium oxide 400 mg she has taken a few times.  3. Spironolactone 100 mg daily.  4. Furosemide 20 mg twice a day consistently. She is taking the spironolactone and furosemide.  5. Lactulose 30 mL 3 times a day. She cannot say how frequently she is actually taking the lactulose.   DISCHARGE MEDICATIONS: Her discharge medications included: 1. Keflex every eight hours 3 times daily for 10 days, however, she was not taking this.  2. She is also supposed to be on nadolol but she is not taking that either.  3. She was supposed to be on pancrelipase but she was not taking that due to cost reasons.  4. She is supposed to be on thiamine. She has not taken that.  5. She was discharged on propoxyphene, Abilify, Lexapro, calcium, potassium, omeprazole and oxycodone which she has not taken any of them.   ALLERGIES:  Intravenous pyelogram dye which causes itching and swelling. Latex, sulfa and oxycodone cause itching. She is also allergic to pollen.   SOCIAL HISTORY: She has been married for 20 years. She has three children. She admits to cocaine use,  last use was about a month ago. She tells me that she is not drinking currently and she denies tobacco use.   FAMILY HISTORY: Father and brother have history of cirrhosis. Father actually died from cirrhosis she reports. Review of records also note that patient's brother likely died from liver cirrhosis as well.   REVIEW OF SYSTEMS: CONSTITUTIONAL: Admits to fever, fatigue, weakness. EYES: Admits to chronic left blurred vision. ENT: Intermittent tinnitus and epistaxis. RESPIRATORY: Intermittent cough, wheeze and intermittent hemoptysis. CARDIOVASCULAR: Admits to dyspnea on exertion and even at rest with some palpitations. GASTROINTESTINAL: Admits to nausea today with intermittent vomiting. No vomiting today though. No diarrhea. She does complain of abdominal pain. No jaundice. GENITOURINARY: Denies dysuria, hematuria. ENDOCRINE: Admits to some sweating but mostly states that she is cold. HEME: Admits to anemia, easy bruising and easy bleeding. INTEGUMENT: Denies new skin rashes or acne. MUSCULOSKELETAL: Denies any muscle pain or joint swelling NEURO: Denies numbness. Admits to change in memory. She states that she has some short-term memory loss issues. PSYCH: Admits to depression.    PHYSICAL EXAMINATION:  VITAL SIGNS: Temperature 97.3, pulse 91, respirations 18, blood pressure 106/62 sating 100% on room air.   GENERAL: Chronically ill appearing Caucasian female with mild temporal wasting in no apparent distress.   EYES: Anicteric sclera. Pupils equal, round, and reactive to light and accommodation. Normal eyelids.   EARS: Normal external ears and nares.   OROPHARYNX: Posterior oropharynx is clear. Tongue is midline.   CARDIOVASCULAR: Normal S1, S2, regular rate and rhythm. No murmurs appreciated. There is 1 to 2+ pitting edema bilateral lower extremities.   PULMONARY: Normal effort. Clear to auscultation bilaterally. No wheezes, rales, or rhonchi appreciated.   ABDOMEN: Distended. There is  shifting dullness. Abdomen is tender to mild palpation. Positive scratch test about 12 cm below the costal margin, although it is difficult to state given the significant abdominal distention. This is concerning for hepatomegaly.   SKIN: Patient does have telangiectasias on her face and minimal on upper chest. Does have increased venous markings on the abdomen but not quite caput medusa. There are no ecchymoses appreciated.   SKIN: Warm and dry. She is pale. There is no jaundice.   MUSCULOSKELETAL: Full strength bilateral upper and lower extremities. Normal tone and there is minimal tremor/hand slap on palmar extension. There is no asterixis.   PSYCH: Awake, alert, oriented x3. Judgment appears intact.   LYMPHATICS: No inguinal or cervical lymphadenopathy appreciated.   LABORATORY, DIAGNOSTIC AND RADIOLOGICAL DATA: CBC 2.1, hemoglobin 8.8, hematocrit 25.6, platelets 98, MCV 89, lipase 276, INR 1.5, PTT 45.1 with a PT of 18.4. Basic metabolic panel shows glucose 103, BUN 4, creatinine 1.02, sodium 141, potassium 2.8, chloride 105, bicarbonate 25, calcium 7.5, bilirubin 1.5, alkaline phosphatase 95, ALT 17, AST 31, total protein 6.3, albumin 2.3, osmolality 278, anion gap 11.   Urinalysis is negative.   Amylase 59.   ASSESSMENT AND PLAN: 44 year old female with history of alcoholic liver cirrhosis with portal gastropathy, grade 1 esophageal varices, history of hepatic encephalopathy, probable spontaneous bacterial peritonitis during last hospitalization presenting with increased abdominal girth, abdominal pain and fever concerning for spontaneous bacterial peritonitis. 1. Spontaneous bacterial peritonitis. Patient received ceftriaxone in the Emergency Department. Will continue this. Will send  blood cultures as well. We will plan for possible ultrasound-guided paracentesis in the a.m. She will likely need blood products such as FFP prior to the procedure. Patient has been educated as to her diagnosis  and her husband was present during the visit.  2. Hypokalemia, this is most likely secondary to chronic hypomagnesemia as a result of chronic alcohol abuse as well as the result of chronic diuresis. This will be repleted.   3. Liver cirrhosis. Will continue patient's neomycin,Lasix and spironolactone. Will continue thiamine and nadolol as well. Will continue her lactulose and will have p.r.n. pain medicines as well as antiemetics.  4. Pancytopenia, this is chronic.  5. Depression. Patient has been off her antidepressants. We can restart her paroxetine in the meanwhile.  6. Hsitory of GI bleed. Continue PPI 7. Prophylaxis. PCBs.  8. Disposition. Patient is being admitted to medical floor for ongoing management.   TIME SPENT ON ADMISSION: 60 minutes. Patient and family have been educated and counseled on the plan of care.   ____________________________ Aurther Loft, DO aeo:cms D: 09/23/2012 00:45:35 ET T: 09/23/2012 07:36:09 ET JOB#: [id number]  cc: Aurther Loft, DO, <Dictator> [facility], Internal Medicine Clinic, Dr. Mirian Mo E Kardell Virgil DO ELECTRONICALLY SIGNED 09/28/2012 1:13

## 2015-04-12 NOTE — Consult Note (Signed)
CC cirrhosis, GI bleeding.  Pt got lactulose enemas and had coffee ground material.  Pt more alert today, requesting some diet change.  Will try full liquids.VSS afebrile. Pt requesting a room change because she did not like this room and requested to go to 3rd floor.  I told her that was not possible as 3rd floor for OB-GYN unless an over fill occurs.   Electronic Signatures: Scot JunElliott, Robert T (MD)  (Signed on 08-Sep-13 09:52)  Authored  Last Updated: 08-Sep-13 09:52 by Scot JunElliott, Robert T (MD)

## 2015-04-12 NOTE — Consult Note (Signed)
No bleeding seen anywhere in the UGI tract today. Pt has mild esophageal varices and portal gastropathy. Moderate amount of bezoar seen. Seen last time, too. Perhaps, patient has gastroparesis. Will start low dose reglan. Will stop phenergan due to drug interaction with reglan. Full liquid diet. Check lipase in AM. Continue nadolol. Thanks.  Electronic Signatures: Lutricia Feilh, Latasia Silberstein (MD)  (Signed on 11-Sep-13 15:34)  Authored  Last Updated: 11-Sep-13 15:34 by Lutricia Feilh, Kavin Weckwerth (MD)

## 2015-04-12 NOTE — H&P (Signed)
PATIENT NAME:  Erica Combs, Erica Combs MR#:  [id number] DATE OF BIRTH:  08-06-1971  DATE OF ADMISSION:  12/05/2012  PRIMARY CARE PHYSICIAN: [facility] but patient is noncompliant.  REFERRING PHYSICIAN: Dr. Glennie Isle   CHIEF COMPLAINT: Altered mental status, nausea and vomiting.   HISTORY OF PRESENT ILLNESS: [name] is a 44 year old Caucasian female with history of chronic alcoholism and liver cirrhosis. Last admission here was on 11/18 when she presented with increased abdominal girth, abdominal pain and she was noncompliant, did not take her medications due to Medicaid issues. This time she presented again with the same issue that she did not get Medicaid until recently and over the last two weeks she did not take any of her medications with the exception of the Antivert, she took that because of dizziness. The patient reports that she was vomiting all the day and this reached to the extent that she gets dizzy whenever she stands up and she will vomit. There was no hematemesis, no coffee-ground emesis, no melena or hematochezia. The patient was brought to the hospital by her husband who also reported that she is not her usual and she has some mental status change. However, during my conversation with the patient she looks appropriate at least at this time. She also describes pain earlier was tenderness at the right upper quadrant, however, during my examination she refers to the right lower quadrant area. She also refers to pain at the mid lower back area. The pain started in the last 24 hours. The pain described as colicky and nagging and mild in intensity. Patient reports no fever, no chills.   REVIEW OF SYSTEMS: CONSTITUTIONAL: Denies any fever. No chills. No fatigue. EYES: No blurring of vision. No double vision. ENT: No hearing impairment. No sore throat. No dysphagia. CARDIOVASCULAR: No chest pain. No shortness of breath. No syncope. RESPIRATORY: No shortness of breath. No cough. No sputum  production. No hemoptysis. GASTROINTESTINAL: Reports nausea, vomiting. No hematemesis. No melena. She also reports right lower quadrant abdominal pain and earlier it was right upper quadrant abdominal pain. GENITOURINARY: No dysuria. No frequency of urination. Her last menstrual period was two years ago. MUSCULOSKELETAL: No joint pain or swelling. No muscular pain or swelling other than her mid lower back pain. INTEGUMENTARY: No skin rash. No ulcers. NEUROLOGY: No focal weakness. No seizure activity. No headache. PSYCH: She looks slightly anxious. No depression. ENDOCRINE: No polyuria or polydipsia. No heat or cold intolerance. HEMATOLOGY: She has mild easy bruisability. No lymph node enlargement.   PAST MEDICAL HISTORY:  1. Alcoholic liver cirrhosis.  2. Chronic alcoholism. 3. Gastritis. 4. Grade 1 esophageal varices with prior history of upper gastrointestinal bleed.  5. Portal gastropathy.  6. History of subacute bacterial peritonitis that was in October 2013.  7. Cholelithiasis.  8. Pancytopenia.  9. History of pancreatitis. 10. Alcohol and cocaine abuse.  11. Hypoalbuminemia. 12. History of splenic rupture. 13. History of broken jaw.   PAST SURGICAL HISTORY: She has mid abdominal incision for previous surgery after having splenic rupture status post surgical repair.   SOCIAL HISTORY: She is married, living with her husband and she has three children.   SOCIAL HABITS: She is nonsmoker, but she continues to drink alcohol, primarily beer. She will consume a case of beer over three to four day period of time. The last drink was about 12 hours ago. She has prior history of cocaine abuse. The last time she used it was two months ago.   FAMILY  HISTORY: Her father has alcoholic liver cirrhosis and her brother as well.   ADMISSION MEDICATIONS: None. The patient is not taking any medication except for Antivert she took today for her dizziness. However, her medications upon discharge last  admission here she was on the following medications: 1. Oxycodone 5 mg q.4-6 hours p.r.n.  2. Spironolactone 100 mg a day. 3. Citalopram 20 mg a day. 4. Benadryl 25 mg q.8 hours p.r.n.  5. Nadolol 20 mg a day. 6. Lasix 40 mg a day. 7. Lactulose 30 mL 3 times a day.  8. Magnesium oxide 400 mg a day.  9. Potassium chloride 20 mEq 2 tablets a day.  10. Rifaximin 550 mg twice a day. 11. Omeprazole 40 mg twice a day.  12. Dilaudid 1 mg at bedtime as needed. 13. Sumatriptan 25 mg once a day p.r.n.   ALLERGIES: Sulfa, latex and intravenous dye. She is also allergic to pollens. Her allergy to oxycodone is itching.   PHYSICAL EXAMINATION:  VITAL SIGNS: Blood pressure 105/67, respiratory rate 20, pulse 93, temperature 98.6, oxygen saturation 100%.   GENERAL APPEARANCE: Young female lying in bed in no acute distress.   HEAD AND NECK: Mild pallor. No icterus. No cyanosis.   ENT: Normal hearing. No discharge. No bleeding. Nasal mucosa was normal without bleeding. No discharge. No ulcers. No lesions. Oropharyngeal area showed no ulcers, no exudates, no oral thrush.   EYES: Normal eyelids and conjunctivae. Pupils about 4 to 5 mm, equal, round, and reactive to light.   NECK: Supple. Trachea at midline. No thyromegaly. No cervical lymphadenopathy. No masses.   HEART: Normal S1, S2. No S3, S4. No murmur. No gallop. No carotid bruits.   RESPIRATORY: Normal breathing pattern without use of accessory muscles. No rales. No wheezing.   ABDOMEN: Soft, is not distended. There is mild tenderness at the right upper quadrant area and also mild tenderness at the right lower quadrant area. No rebound. No rigidity. No hepatosplenomegaly. No masses. There is mid abdominal vertical scar tissue.   MUSCULOSKELETAL: No joint swelling. No clubbing.   SKIN: No ulcers. No subcutaneous nodules. She has multiple spider angiomas at the neck and upper chest area.   NEUROLOGIC: Cranial nerves II through XII are intact.  No focal motor deficit.   PSYCHIATRIC: Patient is alert, oriented x3. Mood and affect were normal.   LABORATORY, DIAGNOSTIC, AND RADIOLOGICAL DATA: CAT scan of the head showed no acute intracranial process. EKG showed normal sinus rhythm at rate of 90 per minute. Serum glucose 101, BUN 7, creatinine 0.8, sodium 141, potassium 3.2, calcium 8.2, albumin 3, total protein 7.8, total bilirubin 2. Her baseline bilirubin is 2 last month. Alkaline phosphatase elevated at 142 with AST 58, these were normal last month. CPK 115. Troponin less than 0.02. CBC showed white count of 1500. Hemoglobin 7.6, last month it was 8. Hematocrit is 24, last month was 25. Platelet count is down to 40, last month was 118. Urinalysis showed cloudy urine, 2 RBCs, 4 white blood cells. Ammonia plasma level was less than 25.   ASSESSMENT:  1. Altered mental status.  2. Vomiting and abdominal pain, likely secondary to her alcoholic hepatitis, however, due to her reference to another pain at the right lower quadrant will obtain CAT scan.   OTHER MEDICAL PROBLEMS:  1. Leukopenia. 2. Anemia. 3. Thrombocytopenia. 4. Hypokalemia. 5. Chronic alcoholism. 6. Liver cirrhosis. 7.  Cholelithiasis 8. Grade 1 esophageal varices. 9. Portal gastropathy. 10. Hypoalbuminemia. 11. History of cocaine abuse.  PLAN: Will admit the patient to the medical floor. Will start gentle IV hydration with normal saline and potassium supplementation to correct the hypokalemia. Zofran p.r.n. to control vomiting. CAT scan of the abdomen without contrast to evaluate the abdominal pain. She refers to the right lower quadrant and also to the right upper quadrant. Pain control with Dilaudid intravenously p.r.n. Resume lactulose to combat any degree of encephalopathy and resume some of her home medications, in particular the nadolol and spironolactone, however, we need to monitor her potassium. However, I will hold the oxycodone since she is not taking it and  citalopram, and I will also hold the Lasix for the time being. Patient needs to quit alcoholism otherwise her condition will just deteriorate.   TIME SPENT: Time Spent in evaluating this patient and reviewing medical records took more than 55 minutes.    ____________________________ Carney CornersAmir M. Rudene Rearwish, MD amd:cms D: 12/05/2012 01:07:27 ET T: 12/05/2012 05:38:25 ET JOB#: [id number]  cc: Carney CornersAmir M. Rudene Rearwish, MD, <Dictator> [facility]  Zollie ScaleAMIR M Marylin Lathon MD ELECTRONICALLY SIGNED 12/05/2012 22:37

## 2015-04-12 NOTE — Consult Note (Signed)
Chief Complaint:   Subjective/Chief Complaint Overall same. Abdomen is somewhat more distended. Paracentesis has not been done yest.   VITAL SIGNS/ANCILLARY NOTES: **Vital Signs.:   03-Oct-13 13:54   Vital Signs Type Routine   Temperature Temperature (F) 100.8   Celsius 38.2   Temperature Source oral   Pulse Pulse 93   Respirations Respirations 20   Systolic BP Systolic BP 982   Diastolic BP (mmHg) Diastolic BP (mmHg) 67   Mean BP 79   Pulse Ox % Pulse Ox % 94   Pulse Ox Activity Level  At rest   Oxygen Delivery Room Air/ 21 %   Brief Assessment:   Additional Physical Exam Abdomen is moderately distended and tender to touch. No rebound.   Lab Results: Routine Chem:  02-Oct-13 05:54    Glucose, Serum  100   BUN  3   Creatinine (comp) 0.89   Sodium, Serum 138   Potassium, Serum 3.5   Chloride, Serum 106   CO2, Serum 26   Calcium (Total), Serum  7.2   Anion Gap  6   Osmolality (calc) 272   eGFR (African American) >60   eGFR (Non-African American) >60 (eGFR values <16m/min/1.73 m2 may be an indication of chronic kidney disease (CKD). Calculated eGFR is useful in patients with stable renal function. The eGFR calculation will not be reliable in acutely ill patients when serum creatinine is changing rapidly. It is not useful in  patients on dialysis. The eGFR calculation may not be applicable to patients at the low and high extremes of body sizes, pregnant women, and vegetarians.)  Routine Coag:  02-Oct-13 05:54    Prothrombin  18.4   INR 1.5 (INR reference interval applies to patients on anticoagulant therapy. A single INR therapeutic range for coumarins is not optimal for all indications; however, the suggested range for most indications is 2.0 - 3.0. Exceptions to the INR Reference Range may include: Prosthetic heart valves, acute myocardial infarction, prevention of myocardial infarction, and combinations of aspirin and anticoagulant. The need for a higher or  lower target INR must be assessed individually. Reference: The Pharmacology and Management of the Vitamin K  antagonists: the seventh ACCP Conference on Antithrombotic and Thrombolytic Therapy. CMEBRA.3094Sept:126 (3suppl): [id number] A HCT value >55% may artifactually increase the PT.  In one study,  the increase was an average of 25%. Reference:  "Effect on Routine and Special Coagulation Testing Values of Citrate Anticoagulant Adjustment in Patients with High HCT Values." American Journal of Clinical Pathology 2006;126:400-405.)  Routine Hem:  02-Oct-13 05:54    WBC (CBC)  2.5   RBC (CBC)  2.91   Hemoglobin (CBC)  8.4   Hematocrit (CBC)  25.8   Platelet Count (CBC)  106   MCV 89   MCH 28.9   MCHC 32.6   RDW  17.1   Neutrophil % 58.6   Lymphocyte % 15.9   Monocyte % 14.7   Eosinophil % 9.8   Basophil % 1.0   Neutrophil # 1.5   Lymphocyte #  0.4   Monocyte # 0.4   Eosinophil # 0.3   Basophil # 0.0 (Result(s) reported on 24 Sep 2012 at 06:14AM.)   Assessment/Plan:  Assessment/Plan:   Assessment Cirrhosis. Ascites. ? SBP. Patient is now febrile.    Plan [place] paracentesis in am. Agree with IV antibiotics. Dr. EVira Agarwill follow her over the weekend and I will return on Momday.   Electronic Signatures: IJill Side(MD)  (Signed 03-Oct-13 17:31)  Authored: Chief Complaint, VITAL SIGNS/ANCILLARY NOTES, Brief Assessment, Lab Results, Assessment/Plan   Last Updated: 03-Oct-13 17:31 by Jill Side (MD)

## 2015-04-12 NOTE — Consult Note (Signed)
Chief Complaint:   Subjective/Chief Complaint Increasing abd pain with eating. No obvious bleeding but hgb dropped.   VITAL SIGNS/ANCILLARY NOTES: **Vital Signs.:   11-Sep-13 07:54   Vital Signs Type Routine   Temperature Temperature (F) 98.4   Celsius 36.8   Temperature Source oral   Pulse Pulse 101   Respirations Respirations 20   Systolic BP Systolic BP 100   Diastolic BP (mmHg) Diastolic BP (mmHg) 62   Mean BP 74   Pulse Ox % Pulse Ox % 96   Pulse Ox Activity Level  At rest   Oxygen Delivery Room Air/ 21 %   Brief Assessment:   Cardiac Regular    Respiratory clear BS    Gastrointestinal epig tenderness   Lab Results: Routine Chem:  10-Sep-13 07:35    Lipase 377 (Result(s) reported on 02 Sep 2012 at 08:11AM.)  Routine Hem:  10-Sep-13 07:35    WBC (CBC) 5.0   RBC (CBC)  2.19   Hemoglobin (CBC)  6.9   Hematocrit (CBC)  20.4   Platelet Count (CBC)  78   MCV 93   MCH 31.6   MCHC 34.0   RDW  15.8   Neutrophil % 61.1   Lymphocyte % 14.2   Monocyte % 17.4   Eosinophil % 6.7   Basophil % 0.6   Neutrophil # 3.0   Lymphocyte #  0.7   Monocyte # 0.9   Eosinophil # 0.3   Basophil # 0.0 (Result(s) reported on 02 Sep 2012 at 08:11AM.)   Assessment/Plan:  Assessment/Plan:   Assessment Anemia with abd pain.    Plan Plan EGD today. NPO until after EGD. Lipase ordered for tomorrow. Morphine PRN for pain.   Electronic Signatures: Lutricia Feilh, Adelfo Diebel (MD)  (Signed 11-Sep-13 08:11)  Authored: Chief Complaint, VITAL SIGNS/ANCILLARY NOTES, Brief Assessment, Lab Results, Assessment/Plan   Last Updated: 11-Sep-13 08:11 by Lutricia Feilh, Adriano Bischof (MD)

## 2015-04-12 NOTE — Consult Note (Signed)
CC: abd pain.  Pt with severe alcoholism, recurrent admissions.  Came with low grade fever and abd pain with distention from ascites.  She received antibiotics for a few days before the paracentesis was done so the cell count is not reliable as to decide if she has SBP or not.  With the low grade fever and the beginning of antibiotics we are committed to give her a full course of treatment for presumed SBP.    Electronic Signatures: Scot JunElliott, Perlita Forbush T (MD)  (Signed on 04-Oct-13 16:38)  Authored  Last Updated: 04-Oct-13 16:38 by Scot JunElliott, Alvey Brockel T (MD)

## 2015-04-12 NOTE — Consult Note (Signed)
Chief Complaint:   Subjective/Chief Complaint abdominal pain off and on for many years   Electronic Signatures: Jovita GammaHashmi, Deatra Mcmahen (MD)  (Signed 08-Oct-13 09:46)  Authored: Chief Complaint   Last Updated: 08-Oct-13 09:46 by Jovita GammaHashmi, Omie Ferger (MD)

## 2015-04-12 NOTE — Consult Note (Signed)
Brief Consult Note: Diagnosis: Abdominal pain ,Upper gi bleeding,Cirrhosis ,possible hernia.   Recommend further assessment or treatment.   Comments: came to see patient .pt went to ct and then to vascular lab for pick line .will see her again when she comes back .Reviwed the chart seems that pt has lot of medical problems and coagulapathy with low platelet count.she had 4 liters of fluid taped from the abdomen recently.  Electronic Signatures: Jovita GammaHashmi, Nickisha Hum (MD)  (Signed 07-Oct-13 17:14)  Authored: Brief Consult Note   Last Updated: 07-Oct-13 17:14 by Jovita GammaHashmi, Collyn Ribas (MD)

## 2015-04-12 NOTE — Consult Note (Signed)
Chief Complaint:   Subjective/Chief Complaint Less abd pain. Lipase coming down.   VITAL SIGNS/ANCILLARY NOTES: **Vital Signs.:   13-Sep-13 07:24   Vital Signs Type Routine   Temperature Temperature (F) 99.1   Celsius 37.2   Temperature Source Oral   Pulse Pulse 105   Respirations Respirations 16   Systolic BP Systolic BP 97   Diastolic BP (mmHg) Diastolic BP (mmHg) 58   Mean BP 71   Pulse Ox % Pulse Ox % 95   Pulse Ox Activity Level  At rest   Oxygen Delivery Room Air/ 21 %   Brief Assessment:   Cardiac Regular    Respiratory clear BS    Gastrointestinal mild tenderness   Lab Results: Routine Chem:  13-Sep-13 04:10    Lipase  503 (Result(s) reported on 05 Sep 2012 at 10:26AM.)   Glucose, Serum  119   BUN  4   Creatinine (comp) 0.79   Sodium, Serum 137   Potassium, Serum 3.7   Chloride, Serum 106   CO2, Serum 22   Calcium (Total), Serum  7.3   Anion Gap 9   Osmolality (calc) 272   eGFR (African American) >60   eGFR (Non-African American) >60 (eGFR values <71m/min/1.73 m2 may be an indication of chronic kidney disease (CKD). Calculated eGFR is useful in patients with stable renal function. The eGFR calculation will not be reliable in acutely ill patients when serum creatinine is changing rapidly. It is not useful in  patients on dialysis. The eGFR calculation may not be applicable to patients at the low and high extremes of body sizes, pregnant women, and vegetarians.)   Magnesium, Serum  1.4 (1.8-2.4 THERAPEUTIC RANGE: 4-7 mg/dL TOXIC: > 10 mg/dL  -----------------------)  Routine Hem:  13-Sep-13 04:10    WBC (CBC) 5.0   RBC (CBC)  2.60   Hemoglobin (CBC)  7.9   Hematocrit (CBC)  23.8   Platelet Count (CBC)  75   MCV 92   MCH 30.4   MCHC 33.2   RDW  15.4   Neutrophil % 60.7   Lymphocyte % 13.0   Monocyte % 20.9   Eosinophil % 4.6   Basophil % 0.8   Neutrophil # 3.0   Lymphocyte #  0.7   Monocyte #  1.0   Eosinophil # 0.2   Basophil # 0.0  (Result(s) reported on 05 Sep 2012 at 05:48AM.)   Assessment/Plan:  Assessment/Plan:   Assessment Pancreatitis. Improving.    Plan Gradually advance diet as tolerated. If patient to be discharged later today or tomorrow, make sure patient stays on low fat diet. Pt knows to quit alcohol. Thanks   Electronic Signatures: OVerdie Shire(MD)  (Signed 13-Sep-13 11:06)  Authored: Chief Complaint, VITAL SIGNS/ANCILLARY NOTES, Brief Assessment, Lab Results, Assessment/Plan   Last Updated: 13-Sep-13 11:06 by OVerdie Shire(MD)

## 2015-04-12 NOTE — Discharge Summary (Signed)
PATIENT NAME:  Erica Combs, Erica Combs MR#:  [id number] DATE OF BIRTH:  [birth year]  DATE OF ADMISSION:  12/05/2012 DATE OF DISCHARGE:  12/06/2012  REASON FOR ADMISSION: Abdominal pain and altered mental status.  DISPOSITION: Home.   DISCHARGE DIAGNOSES:  1. Hepatic encephalopathy, now resolved.  2. Chronic abdominal pain, nausea, and vomiting due to liver cirrhosis, ascites, and possibly chronic pancreatitis.   DISCHARGE MEDICATIONS:  1. Oxycodone 5 mg every six hours p.r.n. pain, only given one week supply. 2. Spironolactone 100 mg once a day. 3. Citalopram 20 mg once a day. 4. Benadryl p.r.n. every eight hours. 5. Nadolol 20 mg once a day. 6. Lasix 40 mg once a day. 7. Lactulose 3 times a day.  8. Magnesium oxide 400 mg daily.  9. Potassium chloride 20 mg daily. 10. Rifaximin 550 mg twice daily.  11. Omeprazole 40 mg once a day. 12. Sumatriptan 25 mg p.r.n.   HOSPITAL COURSE: Erica Combs is very well known patient over here at [facility] who is a 44 year old Caucasian female with history of chronic alcohol abuse and liver cirrhosis who has been admitted on several occasions. Prior to this admission, she was here on 11/18 and discharged on 11/13/2012. At that moment, she came with increased abdomen girth, abdominal pain and noncompliance with her medications. The patient came again on 12/05/2012 with the same issues. She did not get her Medicaid until recently and she was not able to fill up her medications or see her primary care physician. Reports that she was vomiting, getting dizzy, having abdominal pain increased and apparently she had altered mental status. She was admitted as a full admission for these issues. Her mental status was pretty much normal whenever she was seen by Dr. Rudene Rearwish in the ER and please refer to his H and P for more details. She did not seem to have significant changes of her mental status or any type of encephalopathy. So the patient was hydrated and given IV fluids for  that issue and also had Zofran and Phenergan for her nausea and vomiting. She had a CAT scan which did not show any acute abnormalities. She was prescribed different medications including Dilaudid IV, although the patient does not seem to be on much care. She has issues with prescription drug abuse and chronic pain with pain seeking behavior. The patient has been told that she needs to go to her doctor and she has been counseled on many occasions about her compliance, but does not seem to improve. On this discharge, I had a conversation with her case manager about what can we do to prevent readmissions and the suggested plan [facility], medication management, etc., and also a palliative care consult for pain management. The patient is going to do this outpatient. I am going to give her a prescription of OxyContin only for seven day supply. I tried to call her pharmacy and she has not filled up any medications there for the past two months. She does have refills for her chronic medications, but not for her pain. She was discharged here with a prescription of Dilaudid and oxycodone the last time and she did not take it to her regular pharmacy. We tried to look up on the system, as far as any other places she have prescriptions filled in, but we were not able access it. At this moment, again, I am just going to do seven day supply as I do not think the patient needs to  come to the ER to get any more refills or with issues just because she needs followup with her primary care physician. We need to work hard on addressing these issues prior to her discharge.   TIME SPENT: I spent about 45 minutes with this discharge today.  ____________________________ Felipa Furnace, MD rsg:slb D: 12/06/2012 10:34:44 ET T: 12/06/2012 14:34:36 ET JOB#: [id number]  cc: Felipa Furnace, MD, <Dictator> Erica Combs Chance MD ELECTRONICALLY SIGNED 12/07/2012  14:13

## 2015-04-12 NOTE — Discharge Summary (Signed)
PATIENT NAME:  Erica Combs, Erica Combs MR#:  [id number] DATE OF BIRTH:  1971/07/30  DATE OF ADMISSION:  11/11/2012 DATE OF DISCHARGE:  11/13/2012  DISCHARGE DIAGNOSIS: Abdominal pain, likely due to recurrent accumulation of ascites fluid due to noncompliance with medication. Fluid culture remained negative. Was treated with antibiotic for possible spontaneous bacterial peritonitis. Cultures remain negative so antibiotic has been stopped. Also on diuretics and Xifaxan. Still complaining of a lot of pain in the left ribcage thought to be secondary to musculoskeletal in nature per Gastroenterology and surgical consultation, also concern for possible narcotic abuse.   SECONDARY DIAGNOSES:  1. Alcoholic liver cirrhosis.  2. Gastritis. 3. Grade I esophageal varices.  4. Portal gastropathy.  5. History of renal failure. 6. Pancytopenia. 7. Coagulopathy. 8. Pancreatitis. 9. Depression.  10. Acute alcoholic pancreatitis. 11. Alcohol, opiate, and cocaine abuse.  12. Atelectasis.  13. Hypoalbuminemia. 14. Anasarca. 15. History of splenic rupture.  16. Alcohol withdrawal seizure.   CONSULTANTS:  1. Lurline Del, MD - Gastroenterology.  2. Ida Rogue, MD - Surgery.  PROCEDURES/RADIOLOGY: CT scan of the chest, abdomen and pelvis without contrast on 11/09/2012 showed no acute abnormality of the chest. Persistent moderate to large volume of ascites with changes of cirrhotic liver and splenomegaly. Cholelithiasis present. Splenic granulomatous calcification present. IVC present.   Ultrasound-guided paracentesis was done on 11/10/2012 with 4 liters fluid removal, as per patient.   MAJOR LABORATORY PANEL: Urinalysis on admission was negative.   Ascites fluid culture was negative for any growth.   Blood culture x1 was negative.   HISTORY AND SHORT HOSPITAL COURSE: The patient is a 44 year old female with the above-mentioned medical problems who was admitted for abdominal pain and ascites  worrisome for spontaneous bacterial peritonitis. She was started on IV antibiotics, diuretic, and Xifaxan. Gastroenterology consultation was obtained with Dr. Niel Hummer who recommended ultrasound-guided paracentesis which was performed and fluid was sent for evaluation. Fluid remained negative after which antibiotic was stopped as SBP was thought to be very unlikely. She continued to have some left lower rib cage pain for which surgical consultation was obtained who did not find this to be significant, other than musculoskeletal nature of pain. Gastroenterology was also in agreement. There was also concern for possible narcotic abuse in this patient as she continued to request a narcotic. She was stable enough to be discharged home on 11/13/2012 as her pain was well controlled and she had about 4 liters of fluid removed under ultrasound guidance.  DISCHARGE PHYSICAL EXAMINATION:  VITALS: On the date of discharge, her temperature was 98.2, heart rate 79 per minute, respirations 20 per minute, blood pressure 113/69 mmHg, and she was saturating 99% on room air.   CARDIOVASCULAR: S1 and S2 normal. No murmur, rub, or gallop.   PULMONARY: Lungs are clear to auscultation bilaterally. No wheezing, rales, rhonchi, or crepitation.   ABDOMEN: Soft, benign. No organomegaly appreciated. She did have minimal tenderness on her left lower rib cage. No lymph nodes appreciated.   NEUROLOGIC: Nonfocal examination. All other physical examination remained at baseline.   DISCHARGE MEDICATIONS:  1. Oxycodone 5 mg p.o. every 4 to 6 hours as needed.  2. Spironolactone 100 mg p.o. daily.  3. Citalopram 20 mg p.o. daily.  4. Diphenhydramine 25 mg p.o. every eight hours as needed. 5. Nadolol 20 mg p.o. daily.  6. Lasix 40 mg p.o. daily.  7. Lactulose 30 mL p.o. three times daily.  8. Magnesium oxide 400 mg p.o. daily.  9. Potassium chloride 20 mEq  two tablets p.o. daily. 10. Rifaximin 550 mg p.o. twice a  day. 11. Omeprazole 40 mg p.o. twice a day. 12. Dilaudid 1 mg p.o. at bedtime as needed.  13. Sumatriptan 25 mg p.o. daily as needed.   DISCHARGE DIET: Low sodium.   DISCHARGE ACTIVITY: As tolerated.   DISCHARGE INSTRUCTIONS AND FOLLOW-UP: The patient was instructed to follow-up with [facility] in [place] as scheduled on 11/29/2012. She will need follow-up with surgeon, Dr. Juliann PulseLundquist, in 1 to 2 weeks if needed. She will also need follow-up with Dr. Niel HummerIftikhar from gastroenterology in 2 to 4 weeks if needed.   TOTAL TIME DISCHARGING THIS PATIENT: 55 minutes. ____________________________ Ellamae SiaVipul S. Sherryll BurgerShah, MD vss:slb D: 11/15/2012 16:52:33 ET     T: 11/16/2012 15:17:56 ET        JOB#: [id number] cc: Rahkeem Senft S. Sherryll BurgerShah, MD, <Dictator> Christopher A. Lundquist, MD Lurline DelShaukat Iftikhar, MD [facility] [place] Ellamae SiaVIPUL S [facility] MD ELECTRONICALLY SIGNED 11/17/2012 10:59

## 2015-04-12 NOTE — Consult Note (Signed)
Chief Complaint:   Subjective/Chief Complaint Still with abd pain. Has evidence of pancreatitis.   VITAL SIGNS/ANCILLARY NOTES: **Vital Signs.:   12-Sep-13 04:08   Vital Signs Type Routine   Temperature Temperature (F) 99.5   Celsius 37.5   Temperature Source Oral   Pulse Pulse 110   Respirations Respirations 16   Systolic BP Systolic BP 409   Diastolic BP (mmHg) Diastolic BP (mmHg) 81   Mean BP 96   Pulse Ox % Pulse Ox % 96   Pulse Ox Activity Level  At rest   Oxygen Delivery Room Air/ 21 %   Brief Assessment:   Cardiac Regular    Respiratory clear BS    Gastrointestinal Epig tenderness   Lab Results: Hepatic:  12-Sep-13 04:37    Bilirubin, Total  1.5   Alkaline Phosphatase 83   SGPT (ALT) 35   SGOT (AST)  44   Total Protein, Serum  5.6   Albumin, Serum  2.3  Routine Chem:  12-Sep-13 04:37    Lipase  529 (Result(s) reported on 04 Sep 2012 at 05:08AM.)   Glucose, Serum  176   BUN  4   Creatinine (comp) 0.77   Sodium, Serum  134   Potassium, Serum  3.1   Chloride, Serum 102   CO2, Serum 24   Calcium (Total), Serum  7.1   Osmolality (calc) 269   eGFR (African American) >60   eGFR (Non-African American) >60 (eGFR values <64m/min/1.73 m2 may be an indication of chronic kidney disease (CKD). Calculated eGFR is useful in patients with stable renal function. The eGFR calculation will not be reliable in acutely ill patients when serum creatinine is changing rapidly. It is not useful in  patients on dialysis. The eGFR calculation may not be applicable to patients at the low and high extremes of body sizes, pregnant women, and vegetarians.)   Anion Gap 8  Routine Hem:  12-Sep-13 04:37    WBC (CBC) 4.7   RBC (CBC)  2.81   Hemoglobin (CBC)  8.8   Hematocrit (CBC)  25.8   Platelet Count (CBC)  72   MCV 92   MCH 31.3   MCHC 34.0   RDW  15.3   Neutrophil % 69.5   Lymphocyte % 11.5   Monocyte % 13.9   Eosinophil % 4.6   Basophil % 0.5   Neutrophil # 3.3    Lymphocyte #  0.5   Monocyte # 0.7   Eosinophil # 0.2   Basophil # 0.0 (Result(s) reported on 04 Sep 2012 at 04:53AM.)   Assessment/Plan:  Assessment/Plan:   Assessment UGI bleeding-stopped. Pancreatitis.    Plan Stay on liquid diet today. Add panc enzymes and advance diet SLOWLY.   Electronic Signatures: OVerdie Shire(MD)  (Signed 12-Sep-13 07:59)  Authored: Chief Complaint, VITAL SIGNS/ANCILLARY NOTES, Brief Assessment, Lab Results, Assessment/Plan   Last Updated: 12-Sep-13 07:59 by OVerdie Shire(MD)

## 2015-04-12 NOTE — Consult Note (Signed)
Pt knonw to me from previous admission in 3/12. Known alcoholic with hx of cirrhosis, pancreatitis, and hepatic encephalopathy, who comes with increasing abd pain and coffee ground emesis with alcoholic binge this past week. Pt has had several EGD's which showed bleeding from stomach and not from the esophagus. PO2 low. Recommend IV hydration, IV pain meds, check for pancreatitis, PPI and octreotide drip. If patient keeps vomting, may need lactulose enemas as well. Keep NPO. Moniter hgb and transfuse as needed. Will consider repeating EGD if bleeding persists. Will follow. Thanks.   Electronic Signatures: Lutricia Feilh, Nikesh Teschner (MD) (Signed on 05-Sep-13 17:09)  Authored   Last Updated: 05-Sep-13 21:27 by Lutricia Feilh, Mikalyn Hermida (MD)

## 2015-04-12 NOTE — Consult Note (Signed)
Brief Consult Note: Diagnosis: ?SBP.   Patient was seen by consultant.   Comments: Ascites with abdominal pain. ? SBP. Abdominal examination without rebound or guarding. No fever or leucocytosis.  Recommendations: Agree with US guided paracentesis with fluid analysis. OK to hold antibiotics for now. Agree with fluid restrictions. Minimize IV fluids. Will follow. Thanks.  Electronic Signatures: Lurline DelIftikhar, Geetika Laborde (MD)  (Signed 01-Oct-13 17:41)  Authored: Brief Consult Note   Last Updated: 01-Oct-13 17:41 by Lurline DelIftikhar, Jaylun Fleener (MD)

## 2015-04-12 NOTE — Consult Note (Signed)
PATIENT NAME:  Erica Combs, Erica Combs MR#:  [id number] DATE OF BIRTH:  [birth year]  DATE OF CONSULTATION:  09/24/2012  REFERRING PHYSICIAN:  Delfino LovettVipul Shah, MD  CONSULTING PHYSICIAN:  Lurline DelShaukat Damali Broadfoot, MD  REASON FOR CONSULTATION: Abdominal pain.    HISTORY OF PRESENT ILLNESS: This is a 44 year old female with history of alcohol dependence, liver cirrhosis, history of esophageal varices, and recent admission to the hospital with GI bleed. The patient was admitted again yesterday with increasing abdominal discomfort and increasing abdominal girth. The patient was describing the pain as more than 10 out of 10. According to her, she had a fever of up to 101. Apparently the patient was discharged in September on Keflex for suspected spontaneous bacterial peritonitis but she has not been using the antibiotics. The patient probably is not using some of her other medicines as well. The patient was evaluated yesterday on the request of Dr. Delfino LovettVipul Shah. She appeared very comfortable watching TV, did not appear to be in any acute distress, complaining of diffuse abdominal pain which, according to her, is much better than compared to the time of admission. Denies any nausea or vomiting. Denies any change in her bowel habits.   PAST MEDICAL HISTORY:  1. History of alcoholic liver disease. 2. Cirrhosis of the liver.  3. History of gastritis. 4. Grade I esophageal varices. 5. Portal hypertensive gastropathy. 6. History of renal failure. 7. Pancytopenia. 8. Coagulopathy. 9. History of pancreatitis in the past. 10. Depression. 11. History of cocaine abuse. 12. History of alcohol withdrawal seizures.   MEDICATIONS AT HOME:  1. Neomycin. 2. Magnesium.  3. Spironolactone 100 mg once a day. 4. Furosemide 20 mg b.i.d.  5. Lactulose 30 mL 3 times a day.  6. She was discharged on several other medicines that she has not been taking.   ALLERGIES: IVP dye, latex, sulfa, oxycodone, and pollen.   FAMILY AND SOCIAL  HISTORY: Positive for alcohol and cocaine abuse.   REVIEW OF SYSTEMS: Grossly negative except for what is mentioned in the history of present illness.   PHYSICAL EXAMINATION:   GENERAL: Fairly well built female. She does not appear to be in any acute distress. She is clinically not jaundiced.  VITAL SIGNS: Blood pressure about 100/60, pulse 77, oxygen saturation about 92%. She has been afebrile since admission.   LUNGS: Grossly clear to auscultation bilaterally with fair air entry.   CARDIOVASCULAR: Regular rate and rhythm. S1, S2 normal.   ABDOMEN: Mild to moderately distended abdomen. Abdomen is not significantly distended with tense. On palpation with the stethoscope no significant tenderness was noted, although on palpation with hand significant upper abdominal tenderness was noted. There is no rebound. Ascites is present although again as mentioned it is moderate in quantity.   NEUROLOGIC: She is quite awake and alert. No asterixis. Neurologic examination appears to be grossly unremarkable.   LABORATORY, DIAGNOSTIC, AND RADIOLOGICAL DATA: INR 1.5. White cell count 2.5, hemoglobin 8.4, platelet count 106. Electrolytes are fairly unremarkable. Serum lipase is 276. ALT 17, AST 31, total bilirubin 1.5, alkaline phosphatase is normal at 95.   ASSESSMENT AND PLAN: The patient is with increasing ascites as well as abdominal pain most likely secondary to her poor compliance with her medicines. SBP is a concern, although the patient is afebrile and her white cell count is normal and abdominal examination is also fairly benign although SBP remains a possibility. I agree with current management except that I would reduce the IV fluids. I agree with restricting p.o. fluids.  I agree with discontinuing the Lasix and spironolactone and proceeding with an ultrasound-guided paracentesis for fluid analysis as well as for therapeutic reasons.   Plan has been discussed with Dr. Sherryll Burger. Apparently the patient  was started on some antibiotics but had an allergic reaction and she is currently off antibiotics. We can wait for the results of fluid analysis and then decide about the use of antibiotics. Further recommendations to follow.   ____________________________ Lurline Del, MD si:drc D: 09/24/2012 10:09:10 ET T: 09/24/2012 10:30:26 ET JOB#: [id number]  cc: Lurline Del, MD, <Dictator> Lurline Del MD ELECTRONICALLY SIGNED 10/14/2012 17:22

## 2015-04-12 NOTE — Consult Note (Signed)
Brief Consult Note: Diagnosis: Major Depression-Recurrent, Cocaine Abuse, Alcohol Dependence.   Patient was seen by consultant.   Orders entered.   Discussed with Attending MD.   Comments: Erica Combs is a 44 y/o married Caucasian female with a history of recurrent deprsession and polysubstance abuse. PLEASE SEE ADMISSION IN JULY, 2013 ON PSYCHIATRY SERVICE. At that time the patient was sent under an IVC to [facility] by court order for residential susbtance abuse treatment. No toxicology screen done on admission. She should be on Celexa 20mg  po daily for depression rather than Paxil. She declined talking to this Clinical research associatewriter and said she did not feel like she needed to see a psychiatrist. From my past experience with Erica Combs, she has a long history of noncompliance with medications for psychiatric as well as medical treatment and has a difficult time maintaining sobriety and staying clean from cocaine. She should followup with [facility] for outpatient substance abuse treatment at the time of discharge.  Electronic Signatures: Caryn SectionKapur, Colinda Barth (MD)  (Signed 09-Oct-13 18:03)  Authored: Brief Consult Note   Last Updated: 09-Oct-13 18:03 by Caryn SectionKapur, Tatianna Ibbotson (MD)

## 2015-04-12 NOTE — Op Note (Signed)
PATIENT NAME:  Erica Combs, Erica Combs MR#:  [id number] DATE OF BIRTH:  29-Jul-1971  DATE OF PROCEDURE:  09/29/2012  PREOPERATIVE DIAGNOSES:   1. MRSA bacteremia.  2. Encephalopathy.  3. Poor venous access.   POSTOPERATIVE DIAGNOSES:  1. MRSA bacteremia.  2. Encephalopathy.  3. Poor venous access.   PROCEDURES:  1. Ultrasound guidance for vascular access to right brachial vein.  2. Fluoroscopic guidance for placement of catheter.  3. Insertion of peripherally inserted central venous catheter, right arm.  SURGEON:  Annice NeedyJason S. Dew, MD  ANESTHESIA: Local.   ESTIMATED BLOOD LOSS: Minimal.   INDICATION FOR PROCEDURE:  This is a 44 year old female with MRSA bacteremia and poor venous access and we are asked to place a PICC line for antibiotics and venous access.  DESCRIPTION OF PROCEDURE: The patient's right arm was sterilely prepped and draped, and a sterile surgical field was created. The right brachial vein was accessed under direct ultrasound guidance without difficulty with a micropuncture needle and permanent image was recorded. 0.018 wire was then placed into the superior vena cava. Peel-away sheath was placed over the wire. A single lumen peripherally inserted central venous catheter was then placed over the wire and the wire and peel-away sheath were removed. The catheter tip was placed into the superior vena cava and was secured at the skin at 34 cm with a sterile dressing. The catheter withdrew blood well and flushed easily with heparinized saline.    The patient tolerated procedure well.    ____________________________ Annice NeedyJason S. Dew, MD jsd:bjt D: 10/02/2012 10:44:25 ET T: 10/02/2012 10:56:14 ET JOB#: [id number]  cc: Annice NeedyJason S. Dew, MD, <Dictator> Annice NeedyJASON S DEW MD ELECTRONICALLY SIGNED 10/06/2012 13:30

## 2015-04-12 NOTE — Consult Note (Signed)
Brief Consult Note: Diagnosis: Probable SBP.   Patient was seen by consultant.   Comments: Patient with cirrhosis and ascites with recurrent abdominal pain. S/P paracentesis. Ascitic fluid with / SBP. History of portal HTN and esophageal varices. No evidence of encephalopathy or GI bleed.  Recommendations: Agree with IV antibiotics, diuretics and Xifaxan. Will follow and make further recommendations.  Electronic Signatures: Lurline DelIftikhar, Johncarlos Holtsclaw (MD)  (Signed [phone]-Nov-13 18:12)  Authored: Brief Consult Note   Last Updated: 18-Nov-13 18:12 by Lurline DelIftikhar, Nigel Wessman (MD)

## 2015-04-12 NOTE — H&P (Signed)
PATIENT NAME:  Erica Combs, Erica Combs MR#:  [id number] DATE OF BIRTH:  06-18-1971  DATE OF ADMISSION:  11/10/2012  REFERRING PHYSICIAN: Lowella Fairy, MD  PRIMARY CARE PHYSICIAN: The patient reports she is supposed have a physician at [facility] for which she has not been seen there yet.   CHIEF COMPLAINT: Abdominal pain and increased abdominal gas.  HISTORY OF PRESENT ILLNESS: This is a 44 year old female with history of alcohol liver disease and history of varices with multiple hospital admissions, most recently discharged on 10/07/2012 with diagnosis of MRSA bacteremia where she finished IV antibiotics, vancomycin, via PICC line on 10/20/2012, and subacute bacterial peritonitis. The patient presented today with complaints of worsening abdominal pain and increased abdominal girth. The patient reports she has not been taking her medication as she ran out of Medicaid, she only had some lactulose left where she was taking as needed. The patient presented today for her abdominal pain, reports the fluid has been building up over the last few days, but it became tender with abdominal pain over the last 24 hours where she could not tolerate it so she came to the emergency department. In the emergency department, the patient was febrile, and she reports low-grade temperature but no significant fever or chills at home. The patient did not have any leukocytosis. She is known to have history of pancytopenia, which appears to be around her baseline. Hospitalist service was requested to admit the patient for possible need of paracentesis The patient's INR was 1.2. Her urinalysis was negative. The patient had CT of the chest, abdomen, and pelvis as she was complaining of abdominal pain in the left upper quadrant area with known history of splenic rupture. Her CT of the abdomen showed significant amount of pelvic and abdominal ascites and small ventral hernia containing fluids.   PAST MEDICAL HISTORY:  1. Alcoholic  liver cirrhosis with ascites, lower extremity swelling and coagulopathy. 2. Gastritis.  3. Grade I esophageal varices status post recent upper gastrointestinal bleed.  4. Portal gastropathy. 5. Recent hospital course from 10/01 to 10/04/2012 significant for MRSA bacteremia and subacute bacterial peritonitis.  6. History of renal failure. 7. Pancytopenia.  8. Coagulopathy. 9. History of pancreatitis.  10. Depression.  11. History of cocaine abuse.  12. History of acute alcoholic pancreatitis.  13. Alcohol, opiates, and cocaine abuse.  14. Lung atelectasis.  15. Hypoalbuminemia.  16. History of anasarca.  17. History of small post traumatic intracranial hemorrhage.  18. History of splenic rupture status post surgical repair.  19. History of alcohol withdrawal seizures.  20. History of thigh hematoma.  21. Broken jaw.  CURRENT MEDICATIONS: The patient currently is not taking any medication, only lactulose as needed, reports she ran out of medications as well as she did not renew her Medicaid so she could not afford any medication, but she was supposed to be taking these medications as an outpatient: 1. Spironolactone 100 mg oral daily. 2. Oxycodone 15 mg extended release twice a day.  3. Oxycodone 5 mg every six hours as needed.  4. Citalopram 20 mg oral daily.  5. Diphenhydramine 25 mg once every eight hours as needed. 6. Nadolol 20 mg oral daily.  7. Lasix 40 mg oral daily. 8. Lactulose 10 grams oral three times daily. 9. Potassium 20 milliequivalents two tablets oral daily.  10. Magnesium oxide 100 mg oral daily.  11. Rifaximin 550 mg oral twice a day. 12. Omeprazole 40 mg oral twice a day.  ALLERGIES: Intravenous dye, latex,  sulfa, oxycodone, and pollens.   SOCIAL HISTORY: The patient has three children. Admits to cocaine use but reports last time was a month ago. Denies any tobacco use and she denies any recent drinking, reports she is not drinking any alcohol currently.    FAMILY HISTORY: Significant for cirrhosis in father and brother.  REVIEW OF SYSTEMS: The patient denies any fever, fatigue, or weakness. EYES: Denies blurry vision, double vision, or pain. ENT: Denies tinnitus, ear pain, or hearing loss. RESPIRATORY: Denies cough, wheezing, hemoptysis, or dyspnea. CARDIOVASCULAR: Denies chest pain, orthopnea, edema, arrhythmia, or palpitations. GI: Has complaints of nausea. Denies vomiting. Complains of diarrhea secondary to lactulose. Denies any hematemesis, melena, or coffee-ground emesis. Complains of increased abdominal gas and distention and abdominal pain. GU: Denies dysuria, hematuria, or renal colic. ENDOCRINE: Denies polyuria, polydipsia, heat or cold intolerance. HEMATOLOGY: Denies any easy bruising or history of blood clots. INTEGUMENT: Denies acne, rash, or lesions. MUSCULOSKELETAL: Denies any neck pain, shoulder pain, knee pain, arthritis, or cramps. NEUROLOGIC: Denies numbness, weakness, dysarthria, tremors, vertigo, or ataxia. PSYCHIATRIC: Has history of depression and polysubstance abuse. Denies any anxiety or insomnia.   PHYSICAL EXAMINATION:   VITAL SIGNS: Temperature 98.2, pulse 98, respiratory rate 20, blood pressure 143/87, and saturating 97% on room air.   GENERAL: Thin, chronically ill appearing female who looks comfortable in bed, in no apparent distress.   HEENT: Head atraumatic, normocephalic. Pupils are equal and reactive to light. Pink conjunctivae. Moist oral mucosa.   NECK: Supple. No thyromegaly. No JVD.   PULMONARY: Good air entry bilaterally. No wheezing, rales, or rhonchi.   CARDIOVASCULAR: S1 and S2 heard. No rubs, murmurs, or gallops. There is mild pitting edema, +1 bilaterally.   ABDOMEN: Distended, there is shifting dullness. Abdomen is mildly tender to palpation. Bowel sounds present. Has anterior midline abdominal hernia, nontender and reducible.   SKIN: Telangiectasias and increased venous marking in the abdominal  wall. No bruising or ecchymosis. Skin is warm and dry, pale, and no jaundice.   MUSCULOSKELETAL: No joint tenderness, swelling, or erythema.  NEURO: Cranial nerves II through XII grossly intact. Motor five out of five in all extremities.   PSYCH: Awake, alert, and oriented x3. Intact judgment and insight.   PERTINENT LABS: Glucose 144, BUN 4, creatinine 0.85, sodium 134, potassium 3.6, chloride 103, CO2 24, total protein 7.5, albumin 3.1, total bilirubin 2, alkaline phosphatase 117, AST 36, and ALT 22. White blood cells 2.6, hemoglobin 8.1, hematocrit 25.9, and platelets 104. INR 1.2.   ASSESSMENT AND PLAN: This is a 44 year old female with known history of alcoholic liver cirrhosis with esophageal varices and portal gastropathy and there is known previous episodes of hepatic encephalopathy and recent MRSA bacteremia status post IV vancomycin treatment and subacute spontaneous bacterial peritonitis who presents with increased abdominal girth, abdominal pain.  1. Abdominal pain and abdominal gas. CT of abdomen does not show any evidence of splenic rupture. Given the fact of the patient's recent history of MRSA bacteremia and subacute spontaneous bacterial peritonitis, she will be started on IV vancomycin and Zosyn empirically, until she is having paracentesis done by interventional radiology and work-up is done and she is ruled out for SBP as this might be just an episode of recurrent accumulation of ascitic fluid due to noncompliance with medication.  2. Liver cirrhosis. This is secondary to alcoholic liver disease. We will continue the patient on Rifaximin, Lasix, spironolactone, and lactulose. We will continue on p.r.n. morphine and Zofran.  3. Pancytopenia, appears to be  chronic, around her baseline.  4. Depression. We will continue the patient on citalopram.  5. History of gastrointestinal bleed. We will continue the patient on PPI.  6. History of esophageal varices. Continue the patient on  nadolol. 7. History of polysubstance abuse. The patient was counseled, denies any recent cocaine abuse, reports used last month, and denies any current alcohol use as well.  8. Deep vein thrombosis prophylaxis secondary to the patient's pancytopenia. We will hold on chemical anticoagulation and we will continue with sequential compression device.         CODE STATUS: THE PATIENT IS FULL CODE.   TOTAL TIME SPENT ON ADMISSION AND PATIENT CARE: 55 minutes.  ____________________________ Starleen Arms, MD dse:slb D: 11/10/2012 01:20:42 ET T: 11/10/2012 08:29:35 ET JOB#: [id number]  cc: Starleen Arms, MD, <Dictator> Ricki Clack Teena Irani MD ELECTRONICALLY SIGNED 11/14/2012 5:01

## 2015-04-12 NOTE — Consult Note (Signed)
Chief Complaint:   Subjective/Chief Complaint Still c/o abdominal pain. A tender, swollen area near umblicus, ? incarcerated hernia. No fever. Discussed with general surgery, Dr. Cecelia ByarsHashmi. Will obtain CT without contrast today and patient will be evaluated by Dr. Cecelia ByarsHashmi. Further recommendations to follow.   Electronic Signatures: Lurline DelIftikhar, Xayden Linsey (MD)  (Signed 07-Oct-13 14:41)  Authored: Chief Complaint   Last Updated: 07-Oct-13 14:41 by Lurline DelIftikhar, Yolandra Habig (MD)

## 2015-04-12 NOTE — Consult Note (Signed)
PATIENT NAME:  Erica PereyraBROWN, Doralyn F MR#:  [id number] DATE OF BIRTH:  [birth year]  DATE OF CONSULTATION:  11/12/2012  REFERRING PHYSICIAN:  Delfino LovettVipul Shah, MD CONSULTING PHYSICIAN:  Lurline DelShaukat Bexleigh Theriault, MD  REASON FOR CONSULTATION: Abdominal pain, ascites, rule out SBP.   HISTORY OF PRESENT ILLNESS: This is a 44 year old female with history of alcoholic liver disease, history of esophageal varices, history of Methicillin Resistant Staphylococcus Aureus bacteremia in October 2013, history of SBP. The patient was admitted two days ago with worsening abdominal pain and increasing abdominal girth. The patient is noncompliant with her medications and has not been taking her diuretics. She was taking some lactulose as needed. She is complaining of diffuse abdominal pain, but also some pain over the left lower rib area. The patient was admitted with a suspected diagnosis of SBP. CT scan of the abdomen showed significant ascites, but otherwise unremarkable.   PAST MEDICAL HISTORY:  1. Alcoholic liver disease. 2. Gastritis.  3. Grade I esophageal varices. 4. Portal hypertensive gastropathy.  5. History of recent Methicillin Resistant Staphylococcus Aureus bacteremia. 6. Renal failure.  7. Pancytopenia.  8. Coagulopathy.  9. Pancreatitis. 10. Depression. 11. History of cocaine abuse.  12. Acute alcoholic pancreatitis. 13. Hypoalbuminemia. 14. Anasarca.  15. Posttraumatic intracranial hemorrhage.  16. Splenic rupture. 17. Alcohol withdrawal seizures.   HOME MEDICATIONS: She is only taking some lactulose, although she was supposed to be on Nadolol, Lasix, rifaximin, omeprazole and spironolactone.   ALLERGIES: Intravenous pyelogram dye, latex, sulfa, oxycodone and pollen.   SOCIAL HISTORY: Admits to cocaine use.   FAMILY HISTORY: Unremarkable.   REVIEW OF SYSTEMS: Negative except for what is mentioned in the History of Present Illness.   PHYSICAL EXAMINATION:  GENERAL: The patient does not appear to be  toxic or septic. She has a distended abdomen. Overall no jaundice or anemia was noted.   VITAL SIGNS: Temperature, she is afebrile. Pulse around 80, respirations 20, blood pressure about 100-110/60-70.   HEENT: Unremarkable.   NECK: Neck veins are flat.   LUNGS: Grossly clear to auscultation bilaterally.   CARDIOVASCULAR: Regular rate and rhythm.   ABDOMEN: Significantly distended abdomen with positive shifting dullness. Mild diffuse tenderness was noted. A large ventral abdominal wall hernia was noted which is reducible.   EXTREMITIES: Edema.   NEUROLOGIC: Examination appears to be unremarkable.   LABORATORY, DIAGNOSTIC, AND RADIOLOGICAL DATA: White cell count was 2.6, hemoglobin 8.1, platelet count 104. Repeat hemoglobin and platelet count remains stable. INR is 1.2. BUN and creatinine normal. Total bilirubin is 2. The rest of the liver enzymes are unremarkable. Serum lipase is normal. Paracentesis was performed. Two liters of fluid were removed. Cell count is not consistent with SBP, although partially treated SBP may present in this manner.   ASSESSMENT AND PLAN: The patient is with cirrhosis of liver, alcoholic liver disease, portal hypertension, history of ascites, is presenting with increasing abdominal girth and abdominal pain which is probably secondary to increasing ascites rather than SBP, although I would continue her on antibiotics as cell count in the ascitic fluid is somewhat abnormal. The patient also has a history of esophageal varices, but there are no signs of active gastrointestinal bleeding. The patient is not manifesting any signs of overt hepatic encephalopathy either. Recent history of Methicillin Resistant Staphylococcus Aureus and the patient is on IV antibiotics. The patient's diet has been advanced, so she is tolerating IV with probable discharge tomorrow. Plan has been discussed with Dr. Sherryll BurgerShah. She will be discharged on spironolactone, Lasix, rifaximin,  nadolol, as  well as p.o. antibiotics for another week or so. The patient will follow as needed.    ____________________________ Lurline Del, MD si:ap D: 11/12/2012 17:50:00 ET T: 11/13/2012 08:57:40 ET JOB#: [id number]  cc: Lurline Del, MD, <Dictator> Lurline Del MD ELECTRONICALLY SIGNED 11/21/2012 20:03

## 2015-04-12 NOTE — Consult Note (Signed)
PATIENT NAME:  Erica Combs, Erica Combs MR#:  [id number] DATE OF BIRTH:  Aug 06, 1971  DATE OF CONSULTATION:  08/28/2012  CONSULTING PHYSICIAN:  Ezzard StandingPaul Y. Bluford Kaufmannh, MD  REASON FOR REFERRAL: Hematemesis.   HISTORY OF PRESENT ILLNESS: The patient is a 44 year old white female with known history of alcoholism, liver cirrhosis as well as history of coagulopathy and pancreatitis and hepatic encephalopathy who has been binge drinking for the past week or so and not taking her normal medications. She came in with acute epigastric pain associated with nausea and vomiting followed by coffee-grounds emesis. In the ER she was tachycardic and slightly hypotensive. The patient is admitted for further evaluation and treatment.   The patient has been hospitalized multiple times because of her drinking issues. She has been scoped several times already. The last endoscopy was in March of 2013 by me which showed grade I varices and portal gastropathy and gastritis where the source of the bleeding was. Every time she bled it was always from the stomach and not from the esophagus.   PAST MEDICAL HISTORY: Notable for alcoholic cirrhosis. She has gastritis and portal gastropathy. Other history includes history of renal failure, history of pancreatitis, and depression. She does abuse cocaine as well. Other history includes hepatic encephalopathy. She has had splenic repair due to rupture, tubal ligation. She had a subdural hematoma in 2000.  CURRENT MEDICATIONS: Current medications that she was supposed to take which she was not at the time of admission includes Celexa, Aldactone, Lasix, multivitamins, Nexium, thiamine, folic acid, magnesium oxide, lactulose, potassium, and neomycin.   ALLERGIES: She is allergic to intravenous pyelogram dye, sulfa, latex, and pollen.   SOCIAL HISTORY: She uses cocaine, and her drug screen was positive for cocaine and opiates as well as alcohol.   FAMILY HISTORY: Notable for cirrhosis.   REVIEW OF  SYSTEMS: There are no fevers or chills. There is no chest pain or palpitations, coughing, or shortness of breath but she does have abdominal pain and vomiting of blood. There is no gross hematochezia at this time. There are no visual changes or hearing loss. There has not been any change from the initial review of symptoms done by the hospitalist.   PHYSICAL EXAMINATION:  GENERAL: The patient looks chronically ill. She is mildly hypotensive with a blood pressure 97/65 in the emergency room. Pulse rate was 120. O2 saturation was 100% even though pO2 was low at ABG.   HEENT: Normocephalic, atraumatic head. Pupils are equally reactive. Throat was clear.   NECK: Supple. There are some spider angiomas on her face.     LUNGS: Clear bilaterally.   CARDIAC: Tachycardia but was regular.  I did not appreciate any murmurs.   ABDOMEN: Epigastric tenderness. I did not appreciate hepatomegaly today. She had active bowel sounds. It was soft.   EXTREMITIES: No edema.   SKIN: Negative.  NEUROLOGICAL: She was somewhat lethargic but was nonfocal.   LABORATORIES: On admission sodium 135, potassium 3.3, chloride 93, BUN 36, creatinine 1.2, glucose 103. Ammonia level was 35. Lipase was 107, bilirubin 2.7, albumin 2.7. CPK enzymes are normal. Urine tox screen was positive for cocaine and opiates.   LABORATORIES: White count 7.7, hemoglobin 9.4, platelet count 151,000.  INR was 1.7.  Pregnancy test was negative. ABG showed pH of 758, pO2 only 43, pCO2 of 38. Lactic acid level was 5.6.   ASSESSMENT AND PLAN: This is a patient with known liver cirrhosis from alcohol. She continues to abuse alcohol. She is now vomiting some  coffee-grounds emesis with abdominal pain. Even though her initial lipase was normal, she may still have pancreatitis. She could be bleeding from either portal gastropathy  or alcoholic gastritis. I doubt this she is actually bleeding from the varices. Nonetheless, I agree with octreotide drip. She  is also on PPI drip. We will need to monitor hemoglobin and transfuse as needed. She needs to have aggressive IV hydration. If she cannot take anything p.o. because of vomiting we can give her some lactulose by enemas until she can tolerate p.o. She will need some pain medications as well. The patient needs to be kept n.p.o. If the bleeding continues, we will consider repeating upper endoscopy tomorrow. Thank you for the referral.      ____________________________ Ezzard Standing. Bluford Kaufmann, MD pyo:vtd D: 08/29/2012 08:38:00 ET T: 08/29/2012 11:57:15 ET JOB#: [id number]  cc: Ezzard Standing. Bluford Kaufmann, MD, <Dictator> Ezzard Standing Kesean Serviss MD ELECTRONICALLY SIGNED 09/01/2012 10:37

## 2015-04-12 NOTE — Consult Note (Signed)
Chief Complaint:   Subjective/Chief Complaint Appears comfortable but still c/o pain. CT scan unremarkable.   VITAL SIGNS/ANCILLARY NOTES: **Vital Signs.:   08-Oct-13 13:51   Vital Signs Type Routine   Temperature Temperature (F) 98.8   Celsius 37.1   Pulse Pulse 82   Respirations Respirations 21   Systolic BP Systolic BP 264   Diastolic BP (mmHg) Diastolic BP (mmHg) 68   Mean BP 81   Pulse Ox % Pulse Ox % 100   Pulse Ox Activity Level  At rest   Oxygen Delivery Room Air/ 21 %   Brief Assessment:   Additional Physical Exam Abdomen is soft and fairly benign. No defenite hernia was seen today.   Lab Results: Hepatic:  08-Oct-13 05:59    Bilirubin, Total  1.1   Alkaline Phosphatase 76   SGPT (ALT) 16   SGOT (AST) 36   Total Protein, Serum  6.0   Albumin, Serum  2.2  Routine Chem:  08-Oct-13 05:59    Magnesium, Serum  1.7 (1.8-2.4 THERAPEUTIC RANGE: 4-7 mg/dL TOXIC: > 10 mg/dL  -----------------------)   Glucose, Serum 82   BUN  4   Creatinine (comp) 0.86   Sodium, Serum 139   Potassium, Serum 3.5   Chloride, Serum 101   CO2, Serum 30   Calcium (Total), Serum  7.3   Osmolality (calc) 274   eGFR (African American) >60   eGFR (Non-African American) >60 (eGFR values <73m/min/1.73 m2 may be an indication of chronic kidney disease (CKD). Calculated eGFR is useful in patients with stable renal function. The eGFR calculation will not be reliable in acutely ill patients when serum creatinine is changing rapidly. It is not useful in  patients on dialysis. The eGFR calculation may not be applicable to patients at the low and high extremes of body sizes, pregnant women, and vegetarians.)   Anion Gap 8    13:15    Ammonia, Plasma  50 (Result(s) reported on 30 Sep 2012 at 02:22PM.)  Routine Hem:  08-Oct-13 05:59    WBC (CBC)  3.2   RBC (CBC)  2.81   Hemoglobin (CBC)  8.1   Hematocrit (CBC)  24.2   Platelet Count (CBC)  67   MCV 86   MCH 28.8   MCHC 33.5   RDW   17.9   Neutrophil % 59.4   Lymphocyte % 17.3   Monocyte % 14.9   Eosinophil % 7.8   Basophil % 0.6   Neutrophil # 1.9   Lymphocyte #  0.6   Monocyte # 0.5   Eosinophil # 0.3   Basophil # 0.0 (Result(s) reported on 30 Sep 2012 at 06:20AM.)   Assessment/Plan:  Assessment/Plan:   Assessment Ascites, better after paracentesis. Abdominal pain, probably functional. Abdominal examination is benign. ? Gram positive bacteremia, on Vancomycin. ? Abdominal wall hernia, surgical consult pending.    Plan Agree with lactulose and rifaximin for mildly elevated ammonia. Clinically does not appear to be frank;y encephalopathic. Appreciate Dr. BFabio Asahelp. Follow up with GI as OP. No further GI recommendations. Continue current medicines as OP. Will sign off. Please call [place].   Electronic Signatures: IJill Side(MD)  (Signed 08-Oct-13 16:55)  Authored: Chief Complaint, VITAL SIGNS/ANCILLARY NOTES, Brief Assessment, Lab Results, Assessment/Plan   Last Updated: 08-Oct-13 16:55 by IJill Side(MD)

## 2015-04-12 NOTE — Consult Note (Signed)
PATIENT NAME:  Erica Combs, Erica Combs MR#:  [id number] DATE OF BIRTH:  [birth year]  DATE OF CONSULTATION:  09/26/2012  REFERRING PHYSICIAN:  Dr. Allena Katz CONSULTING PHYSICIAN:  Rosalyn Gess. Burley Kopka, MD  REASON FOR CONSULTATION: Fever.   HISTORY OF PRESENT ILLNESS: The patient is a 44 year old white female with a past history significant for alcoholic cirrhosis, hepatic encephalopathy, and variceal bleeding who was admitted on 09/23/2012 with worsening abdominal pain and abdominal distention. The patient was recently admitted from 09/05 through 09/05/2012 with a variceal bleed. At that time, there was some concern over spontaneous bacterial peritonitis, but no paracentesis was performed. She was given antibiotics in-house and discharged on Keflex, but she did not fill the prescription. She states that in the few weeks that she was home, she was having increased abdominal pain, mainly in the lower quadrants, right slightly greater than the left. She also had some low grade fever on one day but no chills or sweats. She was admitted to the hospital on 09/23/2012 and initially was afebrile. Due to her pain, she was started on ceftriaxone, however, paracentesis was not initially performed. The paracentesis was done today. It showed 104 white cells 9% of which were neutrophils, 84% were macrophages, and 7% were lymphocytes. Blood cultures from admission were negative. Yesterday, she began having elevated temperature as high as 102.8. Repeat blood culture has been obtained, but no other cultures other than the paracentesis culture have been obtained. She had approximately 4 liters removed on paracentesis. She states that her abdominal pain is somewhat better. She also had some mental status changes over the last day that have been attributed to her narcotics. She is taking lactulose but states that she is not having loose stool the way she does at home when she takes lactulose. Initially she was given ceftriaxone. However, today  she was changed to Zosyn. She also is on neomycin orally. She denies any cough or shortness of breath. She has had no urinary symptoms.   ALLERGIES: Claforan, intravenous pyelogram dye, oxycodone, sulfa, latex and pollen.   PAST MEDICAL HISTORY:  1. Alcoholic cirrhosis; she states her last drink was prior to her last hospitalization.  2. Ascites.  3. Gastritis.  4. Esophageal varices with recent upper GI bleed.  5. Hepatic encephalopathy.  6. Pancytopenia.  7. Pancreatitis.  8. Depression.  9. Cocaine abuse.  10. Posttraumatic intracranial hemorrhage.  11. Splenic rupture status post surgical repair.  12. Delirium tremens.   SOCIAL HISTORY: She is married with three children. She has a history of cocaine use, the last time about a month ago. She does not smoke. She was a heavy drinker in the past. Her last drink was prior to her last hospitalization.   FAMILY HISTORY: Positive for cirrhosis.  REVIEW OF SYSTEMS: GENERAL: Positive fevers and malaise and fatigue. No sweats or chills. HEENT: Positive headache, some sinus congestion. No sore throat. NECK: No stiffness. No swollen glands. RESPIRATORY: No cough. No shortness of breath or sputum production. CARDIAC: No chest pains or palpitations. No peripheral edema. GI: Positive nausea. No vomiting. Positive abdominal pain, mainly on the right lower quadrant but also on the left lower quadrant. No diarrhea or constipation. No hematemesis. No hematochezia. No melena. No coffee-ground emesis. She has had abdominal bloating and distention. GENITOURINARY: No dysuria. No increased frequency. MUSCULOSKELETAL: No specific muscle or joint complaints. SKIN: No rashes. NEUROLOGIC: No focal weakness. She has been having tremors in her lower extremities bilaterally. PSYCHIATRIC: No complaints.   PHYSICAL EXAMINATION:  VITAL SIGNS: T-max 102.8, T-current 98.5, pulse 81, blood pressure 112/68, and saturation 97% on room air.   GENERAL: A 44 year old white  female in no acute distress, awake and interactive.   HEENT: Normocephalic, atraumatic. Pupils equal and reactive to light. Extraocular motion intact. Sclerae, conjunctivae, and lids are without evidence for emboli or petechiae. There was no significant scleral icterus appreciated. Oropharynx shows no erythema or exudate. Teeth and gums are in fair condition.   NECK: Supple. Full range of motion. Midline trachea. No lymphadenopathy. No thyromegaly.   PULMONARY: Chest clear to auscultation bilaterally with good air movement. No focal consolidation.   CARDIAC: Regular rate and rhythm without murmur, rub, or gallop.   ABDOMEN: Soft but distended throughout. She had no obvious hepatosplenomegaly, but was somewhat limited by her discomfort. She had pain more with rebound than with direct palpation. Most of the rebound discomfort was in the area that was palpated as opposed to distally across the abdomen. No hernia is noted.   EXTREMITIES: No evidence for tenosynovitis.   SKIN: In general, she had icteric skin, but there were no other rashes noted. There were no stigmata of endocarditis, specifically no Janeway lesions or Osler nodes.   NEUROLOGIC: The patient was awake and interactive. She appeared to be able to provide a fairly coherent history of what had been going on with her and appeared to be mentating fairly well. She was moving all four extremities. She did have tremors in both lower extremities, left greater than right.   PSYCHIATRIC: Mood and affect appeared normal.   LABS/RADIOLOGIC STUDIES: BUN 5, creatinine 1.03, bicarbonate 29, anion gap 6, AST 31, ALT 17, alkaline phosphatase 95, and total bilirubin 1.5. White count 3.1 with a hemoglobin 8.3, platelet count 78, ANC 2.4, and ALC 0.2. White count on admission was 2.1.   Blood cultures from admission show no growth.   Blood cultures from yesterday show no growth to date.  Peritoneal fluid had 104 nucleated cells, 9% neutrophils, 7%  lymphocytes, and 84% macrophages.  Urinalysis was negative on admission.  No radiographic studies were available for review.   IMPRESSION: A 44 year old white female with a past history significant for alcoholic cirrhosis, esophageal bleeding, and hepatic encephalopathy who was admitted with abdominal pain and now has fever and mental status changes.  RECOMMENDATIONS:  1. Her fever began a few days after she was admitted, but she reports some low-grade fever at home. Her paracentesis showed less than 250 white cells. The culture is unlikely to be helpful as she was already on antibiotics for several days. Given the relatively few cells present, SBP is less likely.  2. Blood cultures from admission and yesterday are negative.  3. We will send a urinalysis and culture. Her initial urinalysis was negative, but she is now spiking temperatures after being in the hospital for several days.  4. We will check a chest x-ray.  5. Would continue Zosyn for now.  6. Although her pancytopenia is likely related to her underlying liver disease, we will check a HIV antibody test as this would open up several more possible explanations for her fever.   This is a moderately complex infectious disease case.   Thank you very much for involving me in Ms. Bursch's care. ____________________________ Rosalyn GessMichael E. Ladaja Yusupov, MD meb:slb D: 09/26/2012 15:24:53 ET T: 09/26/2012 15:53:30 ET JOB#: [id number]  cc: Rosalyn GessMichael E. Trynity Skousen, MD, <Dictator> Imanol Bihl E Adamari Frede MD ELECTRONICALLY SIGNED 09/30/2012 15:50

## 2015-04-12 NOTE — Consult Note (Signed)
pt seen and chart reviewed completely.She has complaining of abdominal pain for years and has cirrohsis of liver due to drinking she has recently been taped also for about 4000 ml of fluid from the abdomen.In the past she told me had spleed removed or possibly repaired .she has extensive varices in the abdomen i also went and reviewed the ct scan does not show incarcerated hernia but shows diastasis of rectus muscle due to abdominal fluid.Ther is no change of small umbilical hernia which she had all along.i discussed CT in [place] with Dr Excell Seltzerooper today.She does not have bowel obstruction and moving her bowels daily.do not think her pain is from hernia .She is bad surgical rishk because of her cirrhosis varices and due to her low platelets .   Electronic Signatures: Jovita GammaHashmi, Sunny Gains (MD)  (Signed on 08-Oct-13 16:56)  Authored  Last Updated: 08-Oct-13 16:56 by Jovita GammaHashmi, Shalie Schremp (MD)

## 2015-04-12 NOTE — Discharge Summary (Signed)
PATIENT NAME:  Erica PereyraBROWN, Sakina F MR#:  [id number] DATE OF BIRTH:  May 10, 1971  DATE OF ADMISSION:  09/23/2012 DATE OF DISCHARGE:  10/04/2012  DISCHARGE DIAGNOSES:  1. Methicillin Resistant Staphylococcus Aureus bacteremia. 2. Subacute bacterial peritonitis.  3. 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  4. Pancytopenia.  5. Depression.  6. Polysubstance abuse.   The patient was seen and examined on the day of discharge on 10/04/2012.   DISCHARGE MEDICATIONS:  1. Vancomycin 1 gram twice daily until 10/12/2012.  2. Spironolactone 100 mg oral once a day.  3. Oxycodone 15 mg extended release oral twice a day.  4. Oxycodone 5 mg oral every six hours as needed for pain.  5. Citalopram 20 mg oral once a day.  6. Diphenhydramine 25 mg 1 tablet orally every eight hours as needed for itching.  7. Nadolol 20 mg oral once a day.  8. Lasix 40 mg oral once a day.  9. Lactulose 10 grams oral 3 times a day.  10. Potassium chloride 20 mEq 2 tablets orally once a day.  11. Magnesium oxide 400 mg oral once a day.  12. Rifaximin 550 mg oral 2 times a day.  13. Omeprazole 40 mg oral 2 times a day.   ADMITTING HISTORY AND PHYSICAL/HOSPITAL COURSE: Please see interim summary dictated on 10/03/2012.   DISCHARGE INSTRUCTIONS:  1. The patient was discharged home with home health services for IV antibiotics.  2. The patient was to follow up with her primary care physician within a week and also with Dr. Leavy CellaBlocker in a week for Methicillin Resistant Staphylococcus Aureus bacteremia.  3. The patient's vancomycin will be dosed by pharmacy and her PICC line will be removed by home health services once her antibiotics are finished.   TIME SPENT ON THE DAY OF DISCHARGE IN COORDINATION OF CARE, SETTING UP HOME HEALTH SERVICES: Greater than 40 minutes.   ____________________________ Molinda BailiffSrikar R. Hedy Garro, MD srs:ap D: 10/08/2012 19:25:09 ET T: 10/09/2012 11:59:36 ET JOB#: [id number]  cc: Wardell HeathSrikar R. Kajah Santizo, MD,  <Dictator> Rosalyn GessMichael E. Blocker, MD Orie FishermanSRIKAR R Shakeila Pfarr MD ELECTRONICALLY SIGNED 10/20/2012 13:57

## 2015-04-12 NOTE — Consult Note (Signed)
Chief Complaint:   Subjective/Chief Complaint Feels better. Two BM today.   VITAL SIGNS/ANCILLARY NOTES: **Vital Signs.:   20-Nov-13 14:06   Vital Signs Type Routine   Temperature Temperature (F) 97.6   Celsius 36.4   Temperature Source Oral   Pulse Pulse 82   Respirations Respirations 20   Systolic BP Systolic BP 97   Diastolic BP (mmHg) Diastolic BP (mmHg) 62   Mean BP 73   Pulse Ox % Pulse Ox % 92   Pulse Ox Activity Level  At rest   Oxygen Delivery Room Air/ 21 %   Lab Results: TDMs:  20-Nov-13 01:30    Vancomycin, Trough LAB 11 (Result(s) reported on 12 Nov 2012 at 02:14AM.)   Assessment/Plan:  Assessment/Plan:   Assessment Cirrhosis. Ascites. ? SBP. Clinically improved.    Plan Agree with possible DC in am. Continue diuretics, PPI, Nadolol, Lactulose as well as antibiotics as OP. Will sign off. Thanks.   Electronic Signatures: Lurline DelIftikhar, Toluwanimi Radebaugh (MD)  (Signed [phone]-Nov-13 17:50)  Authored: Chief Complaint, VITAL SIGNS/ANCILLARY NOTES, Lab Results, Assessment/Plan   Last Updated: 20-Nov-13 17:50 by Lurline DelIftikhar, Arrian Manson (MD)

## 2015-04-12 NOTE — Consult Note (Signed)
CC: abd pain.  Pt with positive blood culture, neg cult from ascites.  Would treat as SBP with full course. T max 101.6 now 99.6.  chest clear, heart 1/6 sys murmur.  abd distended and somewhat tender.  No new recommendations.  Electronic Signatures: Scot JunElliott, Jannel Lynne T (MD)  (Signed on 05-Oct-13 16:11)  Authored  Last Updated: 05-Oct-13 16:11 by Scot JunElliott, Quinzell Malcomb T (MD)

## 2015-04-12 NOTE — Consult Note (Signed)
CC: cirrhosis with ascites.  She wants fluid restriction cancelled.  VSS T.max 99.6, eating some, up to a shower her serum sodium ok.  Will see how she does without the fluid restriction for a few days.  Chest clear, abd less tender., heart RRR.    Electronic Signatures: Scot JunElliott, Shawnee Higham T (MD)  (Signed on 06-Oct-13 12:43)  Authored  Last Updated: 06-Oct-13 12:43 by Scot JunElliott, Ksean Vale T (MD)

## 2015-04-17 NOTE — Consult Note (Signed)
Chief Complaint:   Subjective/Chief Complaint Hgb stabilized but patient still has some melena, according to patient.   VITAL SIGNS/ANCILLARY NOTES: **Vital Signs.:   28-Mar-13 12:49   Vital Signs Type Recheck   Systolic BP Systolic BP 121   Diastolic BP (mmHg) Diastolic BP (mmHg) 60   Mean BP 75   BP Source manual   Brief Assessment:   Cardiac Regular    Respiratory clear BS    Gastrointestinal distended with diffuse tenderness   Routine Hem:  28-Mar-13 03:59    WBC (CBC) 3.6   RBC (CBC) 2.58   Hemoglobin (CBC) 7.9   Hematocrit (CBC) 23.9   Platelet Count (CBC) 109   MCV 93   MCH 30.5   MCHC 32.8   RDW 17.6  Routine Chem:  28-Mar-13 03:59    Glucose, Serum 82   BUN 7   Creatinine (comp) 0.93   Sodium, Serum 136   Potassium, Serum 3.7   Chloride, Serum 102   CO2, Serum 22   Calcium (Total), Serum 7.1  Hepatic:  28-Mar-13 03:59    Bilirubin, Total 1.9   Alkaline Phosphatase 67   SGPT (ALT) 55   SGOT (AST) 91   Total Protein, Serum 5.4   Albumin, Serum 2.0  Routine Chem:  28-Mar-13 03:59    Osmolality (calc) 269   eGFR (African American) >60   eGFR (Non-African American) >60   Anion Gap 12  Routine Coag:  28-Mar-13 03:59    Prothrombin 17.2   INR 1.4  Routine Hem:  28-Mar-13 03:59    Neutrophil % 62.6   Lymphocyte % 15.0   Monocyte % 18.9   Eosinophil % 2.7   Basophil % 0.8   Neutrophil # 2.3   Lymphocyte # 0.5   Monocyte # 0.7   Eosinophil # 0.1   Basophil # 0.0   Assessment/Plan:  Assessment/Plan:   Assessment UGI bleeding? Nonbleeding esophageal varices. No obvious bleeding site seen on EGD. However, patient has hx of Dieulafoy's, which can often be missed during EGD.    Plan Pt would like a relook. Therefore, will repeat EGD today. Thanks   Electronic Signatures: Verdie Shire (MD)  (Signed 28-Mar-13 12:56)  Authored: Chief Complaint, VITAL SIGNS/ANCILLARY NOTES, Brief Assessment, Lab Results, Assessment/Plan   Last Updated: 28-Mar-13  12:56 by Verdie Shire (MD)

## 2015-04-17 NOTE — Consult Note (Signed)
Brief Consult Note: Diagnosis: ? GI bleed.   Patient was seen by consultant.   Comments: Patient with reported "spitting and coughing up blood". According to her, day before yesterday she had some funny feeling in the back of her throat and spitted blood. Since then no such event although she has coughed up some old blood yesterday and today, according to her. Normal Pineda bowel movements. Hemoglobin 8.9 on admission, now 7.8. Clinically and hemodynamically stable. History of only grade 1 varices and portal hypertensive gastropathy on recent EGD. Thrombocytopenia. Elevated ammonia.  Impression; Cirrhosis with portal HTN. Portal hypertensive gastropathy. Grade 1 varices. Thrombocytopenia. Elevated ammonia without clinical encephalopathy. No evidence of active GI bleed. The above reported event is more consistent with gum bleed or hemoptysis vs minor esophageal tear from gagging. No evidence of variceal bleeding.  Recommendations: Continue Nexium and Nadolol. Follow H and H and transfuse if hemoglobin drops beelow 7.0 or she becomes symptomatic. Will follow. Thanks.  Electronic Signatures: Lurline DelIftikhar, Parisha Beaulac (MD)  (Signed 09-Jul-13 18:16)  Authored: Brief Consult Note   Last Updated: 09-Jul-13 18:16 by Lurline DelIftikhar, Theoren Palka (MD)
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
## 2015-04-17 NOTE — Consult Note (Signed)
Chief Complaint:   Subjective/Chief Complaint More alert. Still more melena? Chronic abd pain. Hgb slowly drifting downwards. Wants to eat solids.   VITAL SIGNS/ANCILLARY NOTES: **Vital Signs.:   27-Mar-13 10:30   Vital Signs Type Q 4hr   Temperature Temperature (F) 98   Celsius 36.6   Temperature Source oral   Pulse Pulse 80   Pulse source per Dinamap   Respirations Respirations 18   Systolic BP Systolic BP 648   Diastolic BP (mmHg) Diastolic BP (mmHg) 70   Mean BP 95   BP Source Dinamap   Pulse Ox % Pulse Ox % 94   Pulse Ox Activity Level  At rest   Oxygen Delivery 1.5L   Brief Assessment:   Cardiac Regular    Respiratory clear BS    Gastrointestinal distended with diffuse abd tenderness   Routine Hem:  27-Mar-13 05:33    WBC (CBC) 3.1   RBC (CBC) 2.55   Hemoglobin (CBC) 7.7   Hematocrit (CBC) 23.4   Platelet Count (CBC) 93   MCV 92   MCH 30.3   MCHC 33.0   RDW 17.0  Routine Chem:  27-Mar-13 05:33    Lipase 355   Glucose, Serum 92   BUN 9   Creatinine (comp) 1.01   Sodium, Serum 133   Potassium, Serum 3.5   Chloride, Serum 99   CO2, Serum 23   Calcium (Total), Serum 6.9  Hepatic:  27-Mar-13 05:33    Bilirubin, Total 2.1   Alkaline Phosphatase 63   SGPT (ALT) 51   SGOT (AST) 98   Total Protein, Serum 5.6   Albumin, Serum 2.1  Routine Chem:  27-Mar-13 05:33    Osmolality (calc) 265   eGFR (African American) >60   eGFR (Non-African American) >60   Anion Gap 11  Routine Hem:  27-Mar-13 05:33    Neutrophil % 62.8   Lymphocyte % 14.7   Monocyte % 18.2   Eosinophil % 3.6   Basophil % 0.7   Neutrophil # 1.9   Lymphocyte # 0.5   Monocyte # 0.6   Eosinophil # 0.1   Basophil # 0.0  Routine Chem:  27-Mar-13 05:33    Ammonia, Plasma 48   Assessment/Plan:  Assessment/Plan:   Assessment UGI bleeding. Pancreatitis. Clinically improving.LFT improving.    Plan Advance diet. MOniter hgb and transfuse as needed. If more melena with drop in hgb, may  consider repeating EGD.   Electronic Signatures: Verdie Shire (MD)  (Signed 27-Mar-13 13:23)  Authored: Chief Complaint, VITAL SIGNS/ANCILLARY NOTES, Brief Assessment, Lab Results, Assessment/Plan   Last Updated: 27-Mar-13 13:23 by Verdie Shire (MD)

## 2015-04-17 NOTE — Consult Note (Signed)
Brief Consult Note: Diagnosis: Alcoholic liver disease.   Patient was seen by consultant.   Consult note dictated.   Discussed with Attending MD.   Comments: Appreciate consult for 44 y/o caucasian woman for alcoholic liver disease, history of cirrhosis for [id number]. She has no physical complaints today, and is on lactulose, neomycin, omeprazole, propanolol, and spironolactone. She states that she has been following [facility] and at one point was referred to the hepatology department, and that her husband, who is not present, has a book with all her liver disease details. She states that she has not been very reliable about following up, and did begin drinking etoh again around Christmas. She has had ascites and paracenteses in the past, and evidentally had some HE, although patient does not remember this. She does state that she thinks she has a clot in the portal vein last year, and at some point was sent home on lovenox, took the shots for a month, and  then decided to stop taking them: I cannot find record of that in our system. Her current Maddrey's score is 14, and she is a Child Pugh class C. On exam she is alert, without asterixis, no ascites/jaundice, no peripheral edema, abdominal pain. Last AST 55, ALT 32; normal ALP, total bilirubin 1.1, albumin 2.7, platelets 82, PT 17.6, INR 1.4, GFR >60, creatinine 0.9, BUN 4, hgb 8.9.  No recent US or NH4 Impression: ALD: Did discuss disease prognosis & treatment with patient. Reinforced the need for abstinence of both etoh and other substances, particularly if patient ever wants to be considered for transplant. She is not favoring going for SA treatment at present. Thinks that her family is enough support and wants to go home. We discussed prognosis both with and without etoh use and I answered all her questions. We  will add an ammonia level and abdominal US with dopplers of the portal, splenic, and hepatic veins.Additionally will order  serum iron to assess the need for supplementation Thank you for this consult- do not feel at present it is necessary to give Lovenox.  Electronic Signatures: Vevelyn PatLondon, Ravis Herne H (NP)  (Signed 06-Feb-13 17:03)  Authored: Brief Consult Note   Last Updated: 06-Feb-13 17:03 by Keturah BarreLondon, Alasia Enge H (NP)

## 2015-04-17 NOTE — Consult Note (Signed)
EGD only showed nonbleeding mild esophageal varices. Stomach and duodenum looked normal. NG removed. Start clears. Continue octreotide and protonix IV for another day. Thanks.  Electronic Signatures: Lutricia Feilh, Lealer Marsland (MD)  (Signed on 24-Mar-13 11:21)  Authored  Last Updated: 24-Mar-13 11:21 by Lutricia Feilh, Jabe Jeanbaptiste (MD)

## 2015-04-17 NOTE — Discharge Summary (Signed)
PATIENT NAME:  Erica Combs, Erica Combs MR#:  [id number] DATE OF BIRTH:  [birth year]  DATE OF ADMISSION:  01/26/2012 DATE OF DISCHARGE:  02/01/2012   HOSPITAL COURSE: The patient was admitted through the Emergency Room due to altered mental status, possible suicidal ideation, depression and agitation, all related to acute alcoholism. In the hospital the patient was put on the standard detox protocol. She detoxed without major incident. No delirium noted. No seizures noted. She has been able to taper off of detox medications at this point and is no longer showing acute signs of alcohol withdrawal. She participated in substance abuse programming appropriately. She received education about the importance of sobriety, particularly in her case, given her cirrhosis. The fact that she would die from her illness if she continued drinking was emphasized several times. For follow-up substance abuse treatment, the patient's resources are limited. The wait for female beds for alcohol and drug abuse treatment center is extremely long and the patient is not interested in going anyway. She is willing to go to outpatient treatment in the community. She agrees that she will do that and get involved in 12 step groups and has gotten alcohol out of the house. Her mood was initially very depressed. This has improved significantly. She has been treated with antidepressants including citalopram and now a small dose of Remeron for mood and sleep. She has tolerated these well. She has shown improved mood. For the last several days she has denied any suicidal ideation. Affect is brighter. Mood is stated as being better. Thoughts are lucid and directed. She has had a better interaction with her husband and seems to have improved her attitude towards her home life. Medically, the patient has cirrhosis. Gastroenterology consult was obtained. Ultrasound of the biliary system was obtained. Ultrasound is typical for cirrhosis. No other acute illness. No  sign of any clot. She has been able to come off of Lovenox. No further change in treatment for her liver disease at this time. She has been educated about cirrhosis and the importance of continued treatment.   LABORATORY DATA: On admission, potassium was low at 3.2. Bilirubin elevated at 2.1, AST elevated at 72, albumin low at 3.1. Alcohol level undetectable. Thyroid stimulating hormone normal. Lipase 199. Drug screen positive for cocaine. CBC showed platelets of 92, hematocrit of 27.9. Follow-up chemistries most recently showed an ammonia of 29 which is normal. Iron of 208, probably because she had been treated with iron. Follow up on the other studies shows albumin to actually be lower at 2.7, AST 55, bilirubin still slightly elevated at 1.1. Ultrasound as noted showed cirrhosis. No clot. Thickened gallbladder.   MEDICATIONS AT DISCHARGE:  1. Celexa 40 mg per day.  2. Inderal 20 mg per day.  3. Aldactone 50 mg per day.  4. Folic acid 1 mg per day.  5. Potassium chloride 10 mEq per day.  6. Lactulose 15 mL twice a day.  7. Neomycin 500 mg every six hours for the next 48 hours.  8. Prilosec 20 mg per day.  9. Ultram 50 mg every six hours p.r.n. pain.  10. Calcium carbonate and vitamin D combined 1 tablet per day.  11. Remeron 15 mg at night.   MENTAL STATUS EXAM AT DISCHARGE: Neatly dressed and groomed. Good eye contact. Normal psychomotor activity. Speech normal in rate, tone, and volume. Affect euthymic and reactive. Denies feeling depressed. States her mood is better. Denies any suicidal or homicidal ideation. States she is optimistic and looking forward  to going home. Thoughts appear lucid. No sign of delusional thinking. Denies hallucinations. Denies suicidal or homicidal ideation. Fair judgment and insight.   DISPOSITION: Discharged home to her family. Follow-up to be arranged for mental health treatment and substance abuse treatment in the community.   DIAGNOSIS PRINCIPLE AND PRIMARY:   AXIS I: Alcohol dependence.   SECONDARY DIAGNOSES:  AXIS I:  1. Depression, not otherwise specified.  2. Cocaine abuse.   AXIS II: Deferred.   AXIS III:  1. Cirrhosis of the liver. 2. Anemia. 3. Gastric reflux symptoms. 4. Hypertension.   AXIS IV: Severe from lack of resources.   AXIS V: Functioning at time of discharge 55.    ____________________________ Audery Amel, MD jtc:ap D: 02/01/2012 10:34:53 ET T: 02/01/2012 11:47:50 ET JOB#: [id number]  cc: Audery Amel, MD, <Dictator> Audery Amel MD ELECTRONICALLY SIGNED 02/01/2012 15:37

## 2015-04-17 NOTE — Consult Note (Signed)
Chief Complaint:   Subjective/Chief Complaint Complaining of throat discomfort and coughing up some old blood. No vomiting or hematemesis. No bowel movements for 2 days.   VITAL SIGNS/ANCILLARY NOTES: **Vital Signs.:   10-Jul-13 16:19   Vital Signs Type Q 4hr   Temperature Temperature (F) 98.2   Celsius 36.7   Pulse Pulse 76   Respirations Respirations 20   Systolic BP Systolic BP 121   Diastolic BP (mmHg) Diastolic BP (mmHg) 74   Brief Assessment:   Additional Physical Exam Abdomen is somewhat distended and mildly tender. No rebound.   Lab Results: Routine Chem:  10-Jul-13 07:07    Ammonia, Plasma  33 (Result(s) reported on 02 Jul 2012 at 07:50AM.)  Routine Hem:  10-Jul-13 07:07    Hemoglobin (CBC)  8.5 (Result(s) reported on 02 Jul 2012 at 07:29AM.)   Hematocrit (CBC)  26.1 (Result(s) reported on 02 Jul 2012 at 07:29AM.)   Assessment/Plan:  Assessment/Plan:   Assessment No evidence of GI bleed. ? minimal bleed from throat or gums. H and H stable. Constipation.    Plan Will increase lactulose to 30 cc bid. Consider ENT consultation. Will sign off. Please call if needed. Thanks.   Electronic Signatures: Lurline DelIftikhar, Sriya Kroeze (MD)  (Signed 10-Jul-13 17:20)  Authored: Chief Complaint, VITAL SIGNS/ANCILLARY NOTES, Brief Assessment, Lab Results, Assessment/Plan   Last Updated: 10-Jul-13 17:20 by Lurline DelIftikhar, Laryssa Hassing (MD)

## 2015-04-17 NOTE — Consult Note (Signed)
EGD again showed nonbleeding min esophageal varices, mild portal gastropathy, and food residue. No bleeding at all in the UGI tract. No signs of Dielafoy's. Low fat diet ordered. COntinue lactulose/neomycin, nadolol, diuretics. I will be out next week. If there are issues, contact GI on call. Otherwise, patient can f/u with us in office after discharge. Thanks.  Electronic Signatures: Lutricia Feilh, Willis Kuipers (MD)  (Signed on 28-Mar-13 14:54)  Authored  Last Updated: 28-Mar-13 14:54 by Lutricia Feilh, Izzah Pasqua (MD)

## 2015-04-17 NOTE — Consult Note (Signed)
Chief Complaint:   Subjective/Chief Complaint no c/o.  some epigastric discomfort after [place] was done, now resolved.   VITAL SIGNS/ANCILLARY NOTES: **Vital Signs.:   07-Feb-13 07:17   Vital Signs Type Q 4hr   Temperature Temperature (F) 97.6   Celsius 36.4   Pulse Pulse 74   Respirations Respirations 20   Systolic BP Systolic BP 118   Diastolic BP (mmHg) Diastolic BP (mmHg) 76   Systolic BP Systolic BP 125   Diastolic BP (mmHg) Diastolic BP (mmHg) 78   Brief Assessment:   Cardiac Regular    Respiratory clear BS    Gastrointestinal details normal Soft  Nontender  Nondistended  No masses palpable  Bowel sounds normal   Routine Chem:  06-Feb-13 18:35    Ammonia, Plasma 29   Iron, Serum 208   HCG Betasubunit Quant. Serum < 1   Radiology Results: [place]:    07-Feb-13 10:35, US Abdomen General Survey   US Abdomen General Survey    REASON FOR EXAM:    cirrhosis; please also add doppler studies of the   portal, splenic, and hepatic v  COMMENTS:   LMP: > one month ago    PROCEDURE: [place]  - US ABDOMEN GENERAL SURVEY  - Jan 31 2012 10:35AM     RESULT: The patient has a history of cirrhosis of the liver. The liver is   sonographically dense, has a nonhomogeneous echotexture and has a nodular   contour. The sonographic findings are consistent with cirrhosis. No focal   hepatic mass is identified. The portal, splenic and hepatic veins are   noted on Doppler examination to be patent. Portal vein flow is   hepatopetal. There is moderate ascites. The spleen is enlarged. The   gallbladder is not visualized as a cystic structure. There could be   stones, sludge or soft tissue in the gallbladder. Note is made that this   appearance has been present previously and is unchanged. The gallbladder     wall is thickened which is a nonspecific finding in a patient with   ascites. The common bile duct measures 2.4 mm in diameter which is within   normal limits. The kidneys show no hydronephrosis.  Sagittally, the right   kidney measures 10.57 cm and the left 10.43 cm.    IMPRESSION:   1. The liver shows changes consistent with cirrhosis.  2. There is a mild amount of ascites present, less prominent than on the   exam of July 11, 2011.  3. Portal vein flow is hepatopetal.  4. The spleen is slightly enlarged.  5. As has been observed previously, the gallbladder has an unusual   appearance and is not seen as a typical cystic structure. Stone, sludge   or soft tissue associated with the gallbladder cannot be excluded.    Thank you for the opportunity to contribute to the care of your patient.           Verified By: [name], M.D., MD   Assessment/Plan:  Assessment/Plan:   Assessment 1) childs pugh class b-c cirrhosis-on appropriate medications.  She is on neomycin as an adjunct treatment for hyperammonemia added to lactulose.  A preferred agent would be xifaxan, with less side effects than neomycin, but more expensive.   2) history of portal vein thrombosis.  [place] today negative for portal vein thrombosis, Patient has not been on lovenox for a month at least, I would not restart at this time, but allow patient to follow-up with  [facility]. 3) I have discussed the implications of etoh abuse with Erica Combs in regards to further liver injury and counsilled etoh abstinence.    Plan as above.  Will sign off for now, reconsult as needed.   Electronic Signatures: Barnetta ChapelSkulskie, Martin (MD)  (Signed 07-Feb-13 22:23)  Authored: Chief Complaint, VITAL SIGNS/ANCILLARY NOTES, Brief Assessment, Lab Results, Radiology Results, Assessment/Plan   Last Updated: 07-Feb-13 22:23 by Barnetta ChapelSkulskie, Martin (MD)
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
## 2015-04-17 NOTE — Consult Note (Signed)
Details:    - Met w pt at the request of Dr. Thurmond Butts to evaluate for discharge follow up.  Pt is known to the [facility] as she was last seen there in 01/1012.  At that time it was impressed upon the pt to go to inpatient alcohol treatment, but pt refused and did not follow up with the agency she was referred to for outpatient treatment at [facility].  Pt minimized her drinking, and wanted to rely on her church group and family to support her abstinance, was sure she would not drink again.  Woke pt to speak w her again today to discuss again going to an inpatient rehab.  Again she minimizes her drinking, saying she drank 1 40 oz beer and 1-2 bottles of "[place]" last Saturday b/c it was her birthday.  Refuses intp rehab, or AA meetings.  Says she plans on going to [facility], which is an outpt agency connected to the court system.  She is court ordered to do 30 hours of DWI classes which she will begin on 03/25/2012.  These are education classes, not enough structure to help her remain abstinant, but at this point is something the pt is willing to do.  Thank you for this consult request, for questions or further assistance needed please call [phone].  Baldo Ash RN LCAS Behavioral Medicine   Electronic Signatures: Encarnacion Chu (LCAS)  (Signed 26-Mar-13 15:29)  Authored: Details   Last Updated: 26-Mar-13 15:29 by Encarnacion Chu ([place])

## 2015-04-17 NOTE — Discharge Summary (Signed)
PATIENT NAME:  Erica Combs, BRUM MR#:  [id number] DATE OF BIRTH:  10/03/1971  DATE OF ADMISSION:  03/15/2012 DATE OF DISCHARGE:  03/21/2012  PRIMARY CARE PHYSICIAN: [facility]   PRIMARY HEPATOLOGIST: [facility]    REASON FOR ADMISSION: Coffee-ground emesis and melena.   DISCHARGE DIAGNOSES:  1. Coffee-ground emesis and melena.  2. Nonbleeding grade I esophageal varices with no stigmata of recent bleeding noted on EGD. 3. Portal hypertensive gastropathy. 4. Ongoing alcohol abuse.  5. Acute posthemorrhagic anemia with acute anemia from melena, coffee-ground emesis requiring blood transfusion in patient with chronic anemia due to liver cirrhosis. 6. Hypovolemic shock, resolved.  7. Acute renal failure, resolved. 8. 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  9. History of liver cirrhosis with chronic anemia, thrombocytopenia, coagulopathy, portal hypertension, esophageal varices, hepatic encephalopathy, hypoalbuminemia with ascites and anasarca. 10. 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  11. History of alcoholic pancreatitis.  12. History of small posttraumatic intracranial hemorrhage which had resolved. 13. History of grade II esophageal varices on previous EGD with variceal bleeding noted January 2012 status post banding.  14. Acute alcoholic pancreatitis. 15. Hypokalemia, resolved.   CONSULTATIONS:  1. GI with Dr. Bluford Kaufmann  2. Substance abuse consult    DISCHARGE DISPOSITION: Home.   DISCHARGE MEDICATIONS: 1. Lasix 20 mg p.o. daily.  2. Aldactone 50 mg p.o. daily.  3. Multivitamin 1 tablet p.o. daily. 4. Nexium 40 mg p.o. b.i.d.  5. Thiamine 100 mg p.o. daily. 6. Folate 1 mg p.o. daily.  7. Magnesium oxide 400 mg p.o. b.i.d. x5 days. 8. Lactulose 10 grams/15 mL take 30 mL p.o. b.i.d.   9. Citalopram 20 mg daily.  10. Potassium chloride 10 mEq daily.  11. Neomycin 500 mg p.o. q.i.d. (after meals and at bedtime). 12. Os-Cal 500 Plus Vitamin D 200 units 1 tab p.o. daily.   DISCHARGE CONDITION: Improved, stable.   DISCHARGE ACTIVITY: As tolerated.   DISCHARGE DIET: Low sodium.   DISCHARGE INSTRUCTIONS: 1. Take medication as prescribed.  2. Return to the Emergency Department for recurrence of symptoms or for worsening abdominal pain or distention, or fevers, chills, or any nausea, vomiting, vomiting up blood or dark material, blood in stool or black stools.  3. Do not consume any alcohol.   FOLLOW-UP INSTRUCTIONS:  1. Follow-up with [facility] within 1 to 2 weeks. The patient needs repeat CBC, CMP, PT/INR, and magnesium level within one week. 2. Follow-up with your liver specialist at [facility] within one week.   ADDITIONAL DISCHARGE INSTRUCTIONS: Do not drink any alcohol ever again.     PROCEDURES:  1. EGD on 03/16/2012 grade I esophageal varices with no stigmata of bleeding from the varices. Mild portal hypertensive gastropathy. Otherwise, exam was unremarkable, within normal limits.  2. Repeat EGD 03/20/2012 grade I esophageal varices with no stigmata of bleeding. Exam was otherwise normal other than mild portal hypertensive gastropathy. No active bleeding noted.   PERTINENT LABORATORY DATA: BUN 44, creatinine 1.42 on admission; BUN 7, creatinine 0.93 from 03/20/2012. Serum alcohol level 0.051% on admission. LFTs on admission total protein 6.4, albumin 2.5, total bilirubin 2.1, AST 96, ALT 37, alkaline phosphatase 55. LFTs on date of discharge total bilirubin 5.4, albumin 2.0, total bilirubin 1.4, AST 57, ALT 42, total protein 5.4, alkaline phosphatase 63. Cardiac enzymes negative  on admission. CBC on admission 7, hemoglobin 5.4, hematocrit 16.5, platelets 127; hemoglobin 7.4, hematocrit 23.2, platelets 98 on the day of discharge. INR 1.5 on admission; 1.4 on  03/20/2012. Lipase level was 686 on admission; 1034 on the day after admission; 355 on 03/19/2012.   BRIEF HISTORY/HOSPITAL COURSE: The patient is a 44 year old female with past medical history of alcohol abuse, alcoholic cirrhosis, end-stage liver disease, ongoing alcohol abuse, portal hypertension, anemia, thrombocytopenia, coagulopathy, ascites, hepatic encephalopathy, alcoholic pancreatitis, depression, substance abuse, small posttraumatic intracranial hemorrhage which has resolved, history of upper GI bleed with gastritis and antral oozing noted on EGD March 2012 status post coagulation and hemostasis, possible Dieulafoy's, grade II esophageal varices in the past with history of variceal bleeding January 2012 status post banding. The patient started drinking alcohol for her birthday three days prior to admission (however, her husband stated that she had been drinking alcohol over the past month). She presented to the Emergency Department secondary to coffee-ground emesis, melena, and abdominal pain. Please see dictated admission history and physical for pertinent details surrounding the onset of this hospitalization. Please see below for further details.   1. Coffee-ground emesis and melena, initial suspicion for upper GI bleed. She was noted to be profoundly anemic at the time of admission. The patient was admitted to the hospital and initially kept n.p.o., placed on IV fluids, IV Protonix, octreotide drips, and was transfused blood after consent was obtained. GI consultation was obtained. Dr. Bluford Kaufmann recommended EGD for further assessment. The patient underwent initial EGD and noted to have grade I esophageal varices with no stigmata of recent bleeding with also mild portal hypertensive gastropathy but no active bleeding was noted. With the measures mentioned above, her coffee-ground emesis had resolved but she still continued to have some melena and further drop of her hemoglobin and thereafter GI recommended  repeat endoscopy. The patient went for repeat endoscopy on date mentioned above and similar findings were found. She was noted to have grade I esophageal varices but no other stigmata of recent bleeding and mild portal hypertensive gastropathy. She did receive additional blood transfusion. Since that time her condition appears to have stabilized with resolution of her melena in addition to her coffee-ground emesis. Neither of these have recurred. Once her condition was noted to have improved, Dr. Bluford Kaufmann recommended gradually weaning her off the octreotide drip and also Protonix drip and switching her to b.i.d. PPI therapy which she has been tolerating well. Exact etiology of the patient's emesis and melena are unclear at this time but likely related to ongoing alcohol abuse. 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 She also has history of grade II esophageal varices with bleed requiring banding but no gastritis or antral oozing or variceal bleeding was noted on this admission. She did have some acute posthemorrhagic anemia from coffee-ground emesis and melena requiring blood transfusion and she has underlying chronic liver cirrhosis with ongoing alcohol abuse. After 3 units of PRBC transfusion, her hemoglobin and hematocrit have improved overall and remained stable over the past couple of days prior to hospital discharge. She was noted to be volume overloaded and has anasarca/ascites secondary to hypoalbuminemia in the setting of liver failure and thereafter she was taken off fluids and not transfused anymore blood. Her anemia appears to be asymptomatic at this time and is likely near her baseline hemoglobin and hematocrit and she has chronic  anemia due to liver cirrhosis. She will need to have her hemoglobin and hematocrit closely monitored as an outpatient. Once her condition was also noted to  have improved, she was transitioned from n.p.o. onto a diet, initially a liquid diet, which she tolerated well. Thereafter, this was advanced to a solid diet which she tolerated well at the time of discharge without any abdominal pain, nausea or vomiting.   2. Hypovolemic shock with volume loss from emesis, melena, and decreased p.o. intake. Blood pressure has improved with volume expansion with IV fluids and PRBCs. Initially her blood pressure medications were held. This lead to resolution of her shock. She has now been taken off fluids as she has anasarca and ascites and volume overload as above so have restarted her Aldactone and Lasix and her blood pressure has remained stable. We also restarted her nadolol due to esophageal varices and to reduce portal pressures. Her shock has resolved and has not recurred.  3. Acute alcoholic pancreatitis, managed in standard fashion with keeping her n.p.o. and giving her IV fluids and pain medication with clinical resolution of her pancreatitis and thereafter her pain medications were weaned. She had been weaned off fluids and also diet has been advanced as above.  4. Acute renal failure, resolved with fluids, blood pressure support, and PRBC transfusion, suspect prerenal etiology from volume depletion versus ATN from hypotension. Nephrotoxic agents were held initially including Lasix and Aldactone and these were also on hold as the patient was in hypovolemic shock and volume deplete. These have now been restarted at the time of discharge due to resolution of her shock as well as her renal failure and due to volume overload with anasarca.  5. Thrombocytopenia due to end-stage liver disease and cirrhosis. Platelet counts did drop some but there were no acute indications for platelet transfusion as her bleeding appears to have subsided. 6. Coagulopathy from end-stage liver disease and cirrhosis which likely won't respond to Vitamin K given advanced liver disease. She is  not profoundly coagulopathic but it was felt that if she bleeds again she may require FFP transfusion but her bleeding has not recurred.  7. Ascites. Given her abdominal pain initially and concern for GI bleed, she was being covered for possible SBP with IV Rocephin and has received one week course of IV Rocephin during this hospitalization, although clinically doubt SBP as she was afebrile and without leukocytosis. In regards to her ascites, I spoke with the radiologist on call who did not feel comfortable performing diagnostic paracentesis due to increased bleeding risk from thrombocytopenia and coagulopathy. Therefore, ascites will be medically managed for now with diuretics and she was advised to [facility] within one week for consideration of paracentesis if she does not respond to oral diuretics and the patient was in agreement with this plan. Though she does have some abdominal swelling, she no longer has any abdominal pain.  8. End-stage liver disease with alcoholic cirrhosis with ongoing alcohol abuse with abnormal liver function tests on admission probably from acute alcoholic hepatitis superimposed on end-stage liver disease and cirrhosis. Dr. Bluford Kaufmannh did not recommend any steroids at this time such as methylprednisolone for her acute alcoholic hepatitis. LFTs improved overall with supportive measures.  9. For portal hypertension, we have restarted her Lasix and Aldactone given anasarca now that her hypotension/hypovolemic shock and acute renal failure have resolved.  10. For esophageal varices, we have restarted her nadolol now that her hypotension and shock have resolved.  11. For hepatic encephalopathy, the patient was maintained on lactulose and neomycin and is mentating at baseline. 12. GI was in agreement with all of the above.  13. She will need to have her AFP levels followed as an outpatient by her primary hepatologist at [facility].  14. Alcohol abuse. The  patient has been successfully detoxed in the hospital in the inpatient setting but really needs long-term detoxification program and both myself and the substance abuse specialist advised her of this but the patient refuses inpatient rehab or Alcoholic Anonymous meetings. She was offered help but was refusing at this point in time and she did not appear to be motivated to quit drinking alcohol and stated to me that she will likely continue to drink alcohol in the future. She denied any suicidal or homicidal ideation. She was covered with CIWA protocol in the hospital for alcohol withdrawal and DT's and did well with CIWA. There were no signs or symptoms of alcohol withdrawal at the time of discharge or DT's and she was felt to have been successfully detoxed. The patient was strongly counseled and encouraged and advised to no longer consume alcohol in the future. She was also kept on thiamine, folate, and multivitamin therapy during this hospitalization.  15. Hypokalemia. She was given potassium chloride replacement and thereafter serum potassium level had normalized and magnesium level was within normal limits.   On 03/21/2012 the patient was hemodynamically stable without any coffee-ground emesis or melena, abdominal pain, was tolerating a normal consistency diet well without any complications and was felt to be stable for discharge home with close outpatient follow-up to which the patient was agreeable. She was also felt to be safe from GI perspective per Dr. Bluford Kaufmann to have the patient discharged home.   TIME SPENT ON DISCHARGE: Greater than 30 minutes.   ____________________________ Elon Alas, MD knl:drc D: 03/25/2012 13:54:32 ET T: 03/26/2012 10:36:02 ET JOB#: [id number]  cc: Elon Alas, MD, <Dictator> [facility] Elon Alas MD ELECTRONICALLY SIGNED 04/02/2012 22:02

## 2015-04-17 NOTE — Consult Note (Signed)
Full consult to follow. Admitted with hematemesis x 3 days. Active drinker. Hx of cirrhosis. Hgb 5 and K 2.8. K repleted. Also, with pancreatitis. Placed on octreotide and protonix drip. BP marginal. Received 2 units of PRBC overnight. Hgb 7.2 this AM. Only old blood per NG.Transfuse another unit of blood. Increase IVF. Plan EGD later this AM. Due to her prior hx of UGI bleeding and hx of cirrhosis, EGD will be done urgently this AM after she was resuscitated last night. Will follow. Thanks    Electronic Signatures: Lutricia Feilh, Kinsley Nicklaus (MD) (Signed on 24-Mar-13 08:50)  Authored   Last Updated: 24-Mar-13 11:01 by Lutricia Feilh, Hamzeh Tall (MD)

## 2015-04-17 NOTE — Consult Note (Signed)
PATIENT NAME:  Erica Combs, READER MR#:  [id number] DATE OF BIRTH:  [birth year]  DATE OF CONSULTATION:  03/16/2012  REFERRING PHYSICIAN:   CONSULTING PHYSICIAN:  Ezzard Standing. Bluford Kaufmann, MD  REASON FOR REFERRAL: Hematemesis.   HISTORY OF PRESENT ILLNESS: This is a 44 year old white female with known alcoholic liver cirrhosis who is an active drinker. She drinks at least 40 ounces of beer per day. She does have a known history of portal gastropathy and esophageal varices. The patient has been endoscoped at least three times in the past. The last two times were either because of melena or hematemesis. Each time the bleeding was felt to be from the stomach and not from the esophageal varices.   Anyway, she has been drinking alcohol since age 19. She started vomiting coffee-ground emesis with melena three days prior to admission. Because she was vomiting continuously and feeling weak, the patient came to the Emergency Room for evaluation. Her hemoglobin was only 5.8. NG was immediately placed. At least 300 mL of old blood came out. I was asked to see the patient for further evaluation.   The patient was immediately started on Octreotide and Protonix drip. She was also given normal saline IV as well. The patient was also given 2 units of blood overnight.   Since then the patient does feel a little better. She still feels weak and somewhat dizzy but denies having any abdominal pain or cramping or indigestion. She has had some black stools but no gross bright blood per rectum. Her blood pressures been somewhat marginal even this morning. There is a little bit of old blood through the NG at this time.   PAST MEDICAL HISTORY:  1. Liver cirrhosis. 2. Varices. 3. Renal failure. 4. Coagulopathy. 5. History of pancreatitis. She does have evidence of pancreatitis at least on the blood test and exam.   FAMILY HISTORY: Cirrhosis from alcohol.   ALLERGIES: Sulfa, latex, IVP dye.   MEDICATIONS AT HOME:   1. Aldactone. 2. Citalopram. 3. Folate. 4. Lactulose. 5. Neomycin. 6. Prilosec. 7. Potassium. 8. Propranolol. 9. Remeron. 10. Tramadol.   REVIEW OF SYSTEMS: No fevers or chills but does complain of weakness. There are no visual or hearing changes. There is no coughing or shortness of breath. There is no chest pain or palpitations. She did complain of some abdominal pain, nausea, vomiting, and melena. The rest of review of systems are negative.  LABORATORY, DIAGNOSTIC, AND RADIOLOGICAL DATA: On admission her white count is 7.0, hemoglobin 5.4, hematocrit 16.5. Lipase was slightly elevated at 686. Sodium 132, potassium 2.9, chloride 87, BUN 44, creatinine 1.42, bilirubin 2.1, AST 96, ALT 37, albumin 2.4. INR 1.5. Troponin level was normal. Alcohol level 51.   PHYSICAL EXAMINATION:   GENERAL: The patient is in no acute distress right now.   VITAL SIGNS: She is afebrile, blood pressure is in the lower 80's this morning, pulse over 100, respirations 18.   HEENT: Normocephalic, atraumatic head. Pupils are equally reactive. Throat was clear.   NECK: Supple.   CARDIAC: Tachycardic rhythm. Otherwise, no murmurs, rubs, or gallops.   LUNGS: Clear bilaterally.   ABDOMEN: Normoactive bowel sounds, soft. There is some mild diffuse tenderness throughout. She had active bowel sounds. I did not appreciate any hepatomegaly.   EXTREMITIES: No clubbing, cyanosis, or edema.   ASSESSMENT AND PLAN: This is a patient who comes in with hematemesis with known history of liver cirrhosis. She appears to have a mild pancreatitis as well from alcohol intake. Her  liver enzymes are abnormal either from cirrhosis or alcoholic hepatitis.   PLAN:  1. Continue with the Protonix and Octreotide drip.  2. She needs to be kept n.p.o.  3. We need to give her more IV fluids.  4. We will transfuse her another unit of blood because her hemoglobin is still low at 7.2 this morning. 5. Will plan for upper endoscopy later  this morning to find the source of bleeding and cauterize if there is active bleeding.   ____________________________ Ezzard StandingPaul Y. Bluford Kaufmannh, MD pyo:drc D: 03/16/2012 10:28:00 ET T: 03/16/2012 11:12:23 ET JOB#: [id number]  cc: Ezzard StandingPaul Y. Bluford Kaufmannh, MD, <Dictator> Wallace CullensPAUL Y Aniyha Tate MD ELECTRONICALLY SIGNED 03/16/2012 14:37

## 2015-04-17 NOTE — Consult Note (Signed)
Chief Complaint:   Subjective/Chief Complaint Patient seen and examined, multiple questionsl answered.  Please see full GI consult.  Patient admitted for detox with history of substance abuse, cocaine and etoh.  [facility] her Georgiann CockerChilds Pugh class b-c cirrhosis.  She expresses to me that she is not interested in liver transplans and has little insight into her illness, continuing to drink heavily (6 40 oz beers daily) and substance abuse.  Some history of being on [facility]enos [facility] ?portal vein thrombosis, however has not used it for over a month.  Typically this is not a treated entity.  Patient on multiple appropriate [facility].  Will order an abdominal us with dopplers of the portal splenic and hepatic veins and an ammonia level.  Labs generally improved since admission.  Following.   VITAL SIGNS/ANCILLARY NOTES: **Vital Signs.:   06-Feb-13 07:27   Vital Signs Type Routine   Temperature Temperature (F) 98   Celsius 36.6   Pulse Pulse 81   Respirations Respirations 20   Systolic BP Systolic BP 109   Diastolic BP (mmHg) Diastolic BP (mmHg) 69   Pulse Pulse Sitting 81   Systolic BP Systolic BP 103   Diastolic BP (mmHg) Diastolic BP (mmHg) 74   Pulse Standing Pulse Standing 89   Brief Assessment:   Cardiac Regular    Respiratory clear BS    Gastrointestinal details normal Soft  Nondistended  No masses palpable  Bowel sounds normal  mild discomfort to palpation of the liver.   Routine Chem:  02-Feb-13 00:22    Creatinine (comp) 0.86  Hepatic:  02-Feb-13 00:22    Bilirubin, Total 2.1   Alkaline Phosphatase 66   SGPT (ALT) 32   SGOT (AST) 72   Albumin, Serum 3.1  Routine Hem:  02-Feb-13 00:22    RBC (CBC) 2.93   Hemoglobin (CBC) 9.3   Hematocrit (CBC) 27.9   Platelet Count (CBC) 92  Routine Chem:  05-Feb-13 05:52    Creatinine (comp) 0.90  Hepatic:  05-Feb-13 05:52    Bilirubin, Total 1.1   Alkaline Phosphatase 72    SGPT (ALT) 32   SGOT (AST) 55   Albumin, Serum 2.7  Routine Hem:  05-Feb-13 05:52    RBC (CBC) 2.76   Hemoglobin (CBC) 8.9   Hematocrit (CBC) 26.9   Platelet Count (CBC) 82  Routine Chem:  06-Feb-13 18:35    Ammonia, Plasma 29   Iron, Serum 208   HCG Betasubunit Quant. Serum < 1   Electronic Signatures: Barnetta ChapelSkulskie, Brandy Kabat (MD)  (Signed 06-Feb-13 19:35)  Authored: Chief Complaint, VITAL SIGNS/ANCILLARY NOTES, Brief Assessment, Lab Results   Last Updated: 06-Feb-13 19:35 by Barnetta ChapelSkulskie, Quinnley Colasurdo (MD)

## 2015-04-17 NOTE — Consult Note (Signed)
Brief Consult Note: Diagnosis: 1. pancytopenia, , coqagulopathy, aquired, etoh liver cirrhjosis with ascitis, hepatic encephelopathy, anasarca, hypoalbuminemia, polysubstance abuse and dependence, suicidal ideation, alcohol withdrawal, h/o GIB,.   Patient was seen by consultant.   Consult note dictated.   Recommend further assessment or treatment.   Orders entered.   Discussed with Attending MD.   Comments: 1. 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 Repeat Fe studies, supplement Fe PO if needed, continue folate, thiamine. Following, transfuse if needed. No need for  neutropenic precautions at this time yet. 2. Coagulopathy, aquired, repeat protime. 3. ascitis, stable, contioneu spironolactone 4. ETOH withdrawal, contnue CIWA scale, stable 5. hypomagnesemia, needs to be replenished PO or IV, following Thanks for consult , we'll follow.  Electronic Signatures: Katharina CaperVaickute, Twania Bujak (MD)  (Signed 05-Jul-13 11:12)  Authored: Brief Consult Note   Last Updated: 05-Jul-13 11:12 by Katharina CaperVaickute, Gerianne Simonet (MD)

## 2015-04-17 NOTE — H&P (Signed)
PATIENT NAME:  Erica Combs, Erica Combs MR#:  [id number] DATE OF BIRTH:  08-09-1971  DATE OF ADMISSION:  03/15/2012  PRIMARY CARE PHYSICIAN: Nonlocal.   REFERRING PHYSICIAN: Daryel NovemberJonathan Williams, MD    CHIEF COMPLAINT: Coffee-ground vomiting and melena for three days.   HISTORY OF PRESENT ILLNESS: The patient is a 44 year old Caucasian female with multiple medical problems, presented to the ED with the above chief complaint. The patient is alert, awake, oriented, in no acute distress now. The patient has a history of liver cirrhosis, esophageal varices, gastrointestinal bleeding and anemia, hepatitic encephalopathy, gastroesophageal reflux disease, pancytopenia. She has been drinking alcohol for a long time since she was 44 years old. She developed coffee-ground vomiting and black stool three days ago. For the past three days, she has vomited coffee-ground vomitus many times, but she did not see any doctor and still drank alcohol every day. Today she drank a lot of beer and liquor and developed vomiting blood and melena again; so, she came to the ED for further evaluation. In the Emergency Department, she was found to have a very low hemoglobin, about 5, and was treated with NG tube suction, Protonix and octreotide drip.  In addition, the patient has been treated with normal saline IV. The patient said she feels weak, dizzy, but denies any other symptoms.   PAST MEDICAL HISTORY:  1. Alcoholism.  2. Liver cirrhosis. 3. Gastritis. 4. Esophageal varices. 5. Anemia. 6. Renal failure. 7. Pancytopenia. 8. Coagulopathy. 9. Pancreatitis. 10. Depression.  11. Cocaine abuse.   SOCIAL HISTORY: The patient denies any smoking or illicit drugs, but she said she had been drinking heavily for many years. She drinks alcohol daily since 44 years old.   FAMILY HISTORY: Father and brother had cirrhosis. Her brother died possibly due to liver cirrhosis.   ALLERGIES: IV dye, sulfa drugs, latex and pollen.  HOME  MEDICATIONS:  1. Aldactone 50 mg p.o. daily.  2. Calcarb with D, 1 tablet p.o. daily.  3. Citalopram 40 mg p.o. daily.  4. Folic acid 1 mg p.o. daily.  5. Lactulose 15 mL twice daily.  6. Neomycin 500 mg p.o. 4 times a day.  7. Omeprazole 20 mg p.o. once daily.  8. Potassium 10 mEq p.o. daily.  9. Propranolol 20 mg p.o. daily.  10. Remeron 15 mg p.o. at bedtime. 11. Tramadol 50 mg p.o. t.i.d. p.r.n. for pain.   REVIEW OF SYSTEMS: CONSTITUTIONAL: The patient denies any fever or chills. No headache but has dizziness and generalized weakness. EYES: No double vision or blurred vision. ENT: No epistaxis or postnasal drip. No discharge from nose or ear. RESPIRATORY: No cough, sputum, shortness of breath or hemoptysis. CARDIOVASCULAR: No chest pain, palpitations, orthopnea or nocturnal dyspnea. GASTROINTESTINAL: Positive for abdominal pain, nausea, vomiting, and melena. GENITOURINARY: No dysuria, hematuria, or incontinence. SKIN: No rash or jaundice. HEMATOLOGY: Positive for GI bleeding but no easy bruising. She has anemia. SKIN: No rash or jaundice. PSYCHIATRIC: No depression, no anxiety. NEUROLOGY: No syncope, loss of consciousness or seizure.   LABORATORY, DIAGNOSTIC AND RADIOLOGICAL DATA:  WBC 7.0, hemoglobin 5.4, hematocrit 16.5.  Lipase 686, glucose 114, BUN 44, creatinine 1.42, sodium 132, potassium 2.9, chloride 87, calcium 78, bilirubin 2.1, SGPT 37, SGOT 96, albumin 2.5, anion gap 23.  PT 18.4, INR 1.5.  Troponin less than 0.02.  Alcohol level 51.   PHYSICAL EXAMINATION:  GENERAL: The patient is alert, awake, oriented, in no acute distress.  NG tube with coffee ground liquid, suction 300 mL.  VITAL SIGNS: Temperature 96.7, blood pressure 103/54, pulse 110, respirations 18, oxygen saturation 94% on room air.   HEENT: Pupils are round, equal, reactive to light and accommodation Moist oral mucosa. Clear oropharynx.   NECK: Supple. No JVD or carotid bruit. No lymphadenopathy.    CARDIOVASCULAR: S1, S2, regular rate and rhythm. No murmurs, rubs, or gallops.   PULMONARY: Bilateral air entry. No wheezing or rales. No use of muscles to breathe.   ABDOMEN: Soft, diffuse tenderness, no rigidity, no rebound. Bowel sounds are present. It is difficult to estimate whether the patient has organomegaly due to tenderness.   EXTREMITIES: No edema, clubbing, or cyanosis. No calf tenderness. Strong bilateral pedal pulses.   SKIN: No rash or jaundice or bruises.   NEUROLOGICAL: Alert and oriented x3. No focal deficits. Deep tendon reflexes 2+. Power 5 out of 5.   IMPRESSION:  1. Acute upper gastrointestinal bleeding possibly due to liver cirrhosis and esophageal varices.  2. Severe anemia.  3. Hypokalemia.  4. Hyponatremia.  5. Dehydration.  6. Pancreatitis.  7. Thrombocytopenia.  8. Liver cirrhosis.     PLAN OF TREATMENT:  1. The patient will be admitted to the CCU Stepdown Unit.  2. We will keep n.p.o., continue normal saline IV fluids,  potassium with normal saline. Follow up with packed red blood cells transfusion. Continue Protonix, octreotide drip.  3. We will get a GI consult from Dr. Bluford Kaufmann for possible endoscopy.  4. We will hold propranolol and neomycin now and defer the decision to GI consult.  5. We will follow up with complete metabolic panel, magnesium level, phosphate level.  6. We will give thiamine, folic acid and Ativan p.r.n.  7. Deep vein thrombosis prophylaxis with TEDs.   I discussed the patient's critical condition and the plan of treatment with the patient.   TIME SPENT: About 75 minutes.  ____________________________ Shaune Pollack, MD qc:cbb D: 03/15/2012 19:32:15 ET T: 03/16/2012 08:29:47 ET JOB#: [id number]  cc: Shaune Pollack, MD, <Dictator> Shaune Pollack MD ELECTRONICALLY SIGNED 03/16/2012 16:03

## 2015-04-17 NOTE — Consult Note (Signed)
PATIENT NAME:  Erica PereyraBROWN, Suzanne F MR#:  [id number] DATE OF BIRTH:  Jun 10, 1971  DATE OF CONSULTATION:  07/02/2012  REFERRING PHYSICIAN:  Dr. Maryruth BunKapur  CONSULTING PHYSICIAN:  Lurline DelShaukat Adison Reifsteck, MD  REASON FOR CONSULTATION: Questionable hematemesis.   HISTORY OF PRESENT ILLNESS: This is a 44 year old female with history of alcohol dependence and major depression. The patient was admitted to the Psych Unit because she had a relapse on alcohol and has been noncompliant with her outpatient medications for hypertension, cirrhosis of the liver, and she has been behaving fairly strangely. The patient also tested positive for cocaine on admission. Some history of vomiting a small amount of blood was reported and, therefore, Gastroenterology was consulted. The patient was evaluated yesterday as well as today. There has been no further vomiting reported. According to her, she has been coughing up a small amount of dark old blood. Today she also tells me that she had some dark bloody material from her nose after she used some saline spray. There has been no vomiting or hematemesis reported either by the nursing staff nor by the patient herself. She has not had any melena or bright red blood per rectum. In fact, she has been constipated for the last two days.   PAST MEDICAL HISTORY:  1. History of cirrhosis of liver. She underwent an EGD in March of this year which showed only grade I esophageal varices as well as portal hypertensive gastropathy.  2. History of anemia. 3. Pancytopenia. 4. History of tubal ligation.   MEDICATIONS AT HOME:  1. Celexa. 2. Lasix. 3. Aldactone.  4. Multivitamin.  5. Nexium. 6. Thiamine. 7. Folate. 8. Magnesium. 9. Lactulose. 10. Potassium. 11. Neomycin.   ALLERGIES: IVP dye and sulfa.   SOCIAL HISTORY: She denies using cocaine. She denies using alcohol recently, although use of both have been confirmed recently.   FAMILY HISTORY: Unremarkable.   REVIEW OF SYSTEMS: She is  complaining of some mild abdominal pain. Denies any weakness, dizziness, syncope, melena, or hematochezia.   PHYSICAL EXAMINATION:   GENERAL: Fairly well built female, does not appear to be in any acute distress. She does appear to be somewhat pale.   VITAL SIGNS: Temperature 98.2, pulse 76, respirations 18 to 20, blood pressure 121/74.   NECK: Neck veins are flat.   LUNGS: Grossly clear to auscultation bilaterally.   CARDIOVASCULAR: Regular rate and rhythm.   ABDOMEN: Slight abdominal distention. Mild tenderness. No rebound or guarding was noted. No hepatomegaly was noted. No ascites. Abdominal examination is overall benign.   NEUROLOGIC: Neurologic exam did not reveal any asterixis.   LABORATORY, DIAGNOSTIC, AND RADIOLOGICAL DATA: INR was 1.2. The patient has been anemic and her baseline hemoglobin is between 8.5 and 9. Most recent hemoglobin is 8.5. Serum ammonia is 33. It was 57 on admission. Total bilirubin is 1.5. AST 49. Rest of the liver enzymes are unremarkable.   ASSESSMENT AND PLAN: The patient is with questionable hematemesis. Based on her description as well as the hospital course, I do not see any signs of GI bleeding. Most likely the patient has had some gum bleeding or bleeding from her throat that has caused her to cough up some blood. Again, her hemoglobin and hematocrit are stable and her upper GI endoscopy three months ago was fairly unremarkable except for portal hypertensive gastropathy and grade I esophageal varices only. The patient's hemoglobin and hematocrit have remained stable. I would recommend continuing her on PPI as well as Nadolol. Increase lactulose to 3 times a  day due to her being constipated. If the patient continues to complain of cough or spitting up blood, then ENT consultation would be appropriate. No further recommendations. We will sign off.   Please do not hesitate to call me if I can be of any further assistance.    ____________________________ Lurline Del, MD si:drc D: 07/02/2012 17:27:55 ET T: 07/03/2012 08:52:40 ET JOB#: [id number]  cc: Lurline Del, MD, <Dictator> Lurline Del MD ELECTRONICALLY SIGNED 07/04/2012 15:08

## 2015-04-17 NOTE — Consult Note (Signed)
Chief Complaint:   Subjective/Chief Complaint Hard to arouse. Getting pain meds. C/o abd pain earlier. Abd soft though. Lipase improving. Abx started in case she has SBP. No signs of further bleeding.   VITAL SIGNS/ANCILLARY NOTES: **Vital Signs.:   25-Mar-13 15:14   Vital Signs Type Routine   Temperature Temperature (F) 97.9   Celsius 36.6   Temperature Source axillary   Pulse Pulse 91   Pulse source per Dinamap   Respirations Respirations 20   Systolic BP Systolic BP 99   Diastolic BP (mmHg) Diastolic BP (mmHg) 63   Mean BP 75   BP Source Dinamap   Pulse Ox % Pulse Ox % 91   Pulse Ox Activity Level  At rest   Oxygen Delivery 2L   Brief Assessment:   Cardiac Regular    Respiratory clear BS    Gastrointestinal soft. tender?     Routine Chem:  25-Mar-13 06:02    Glucose, Serum 129   BUN 22   Creatinine (comp) 0.93   Sodium, Serum 130   Potassium, Serum 3.6   Chloride, Serum 95   CO2, Serum 26   Calcium (Total), Serum 6.7  Hepatic:  25-Mar-13 06:02    Bilirubin, Total 2.9   Alkaline Phosphatase 58   SGPT (ALT) 40   SGOT (AST) 102   Total Protein, Serum 6.0   Albumin, Serum 2.3  Routine Chem:  25-Mar-13 06:02    Osmolality (calc) 266   eGFR (African American) >60   eGFR (Non-African American) >60   Anion Gap 9   Lipase 467   Assessment/Plan:  Assessment/Plan:   Assessment GI bleeding. Stopped for now. Pancreatitis. Improving by numbers.    Plan Continue clear liquid. Advance as tolerated if lipase continues to fall. Continue Abx for now. Check ammonia level.If high, may need to increase lactulose dose. Can taper off octreotide drip. Can switch to po protonix daily. I will be in [facility] tomorrow. Will check back on Wed. Call GI on call tomorrow if questions. Thanks.   Electronic Signatures: Verdie Shire (MD)  (Signed 25-Mar-13 17:14)  Authored: Chief Complaint, VITAL SIGNS/ANCILLARY NOTES, Brief Assessment, Lab Results, Assessment/Plan   Last Updated:  25-Mar-13 17:14 by Verdie Shire (MD)
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
